# Patient Record
Sex: Female | Born: 1935 | Race: White | Hispanic: No | Marital: Married | Smoking: Never smoker
Health system: Southern US, Community
[De-identification: ages and names within clinical notes are randomized; demographics above are authoritative.]

## PROBLEM LIST (undated history)

## (undated) DIAGNOSIS — M1990 Unspecified osteoarthritis, unspecified site: Secondary | ICD-10-CM

## (undated) DIAGNOSIS — Z974 Presence of external hearing-aid: Secondary | ICD-10-CM

## (undated) DIAGNOSIS — T753XXA Motion sickness, initial encounter: Secondary | ICD-10-CM

## (undated) DIAGNOSIS — G43909 Migraine, unspecified, not intractable, without status migrainosus: Secondary | ICD-10-CM

## (undated) DIAGNOSIS — K219 Gastro-esophageal reflux disease without esophagitis: Secondary | ICD-10-CM

## (undated) DIAGNOSIS — I10 Essential (primary) hypertension: Secondary | ICD-10-CM

## (undated) DIAGNOSIS — J45909 Unspecified asthma, uncomplicated: Secondary | ICD-10-CM

## (undated) DIAGNOSIS — R609 Edema, unspecified: Secondary | ICD-10-CM

## (undated) HISTORY — PX: ABDOMINAL HYSTERECTOMY: SHX81

## (undated) HISTORY — PX: APPENDECTOMY: SHX54

## (undated) HISTORY — PX: TONSILLECTOMY: SUR1361

## (undated) HISTORY — PX: CHOLECYSTECTOMY: SHX55

## (undated) HISTORY — PX: GALLBLADDER SURGERY: SHX652

---

## 2007-08-21 ENCOUNTER — Ambulatory Visit: Payer: Self-pay | Admitting: Emergency Medicine

## 2007-09-26 ENCOUNTER — Ambulatory Visit: Payer: Self-pay | Admitting: Internal Medicine

## 2007-10-08 ENCOUNTER — Ambulatory Visit: Payer: Self-pay | Admitting: Emergency Medicine

## 2007-10-10 ENCOUNTER — Inpatient Hospital Stay: Payer: Self-pay | Admitting: Internal Medicine

## 2007-10-10 ENCOUNTER — Ambulatory Visit: Payer: Self-pay | Admitting: Internal Medicine

## 2007-10-13 ENCOUNTER — Inpatient Hospital Stay: Payer: Self-pay | Admitting: Internal Medicine

## 2007-10-13 ENCOUNTER — Other Ambulatory Visit: Payer: Self-pay

## 2009-04-17 ENCOUNTER — Ambulatory Visit: Payer: Self-pay | Admitting: Internal Medicine

## 2009-09-28 ENCOUNTER — Ambulatory Visit: Payer: Self-pay | Admitting: Family Medicine

## 2010-02-19 ENCOUNTER — Emergency Department: Payer: Self-pay | Admitting: Emergency Medicine

## 2010-03-19 ENCOUNTER — Ambulatory Visit: Payer: Self-pay | Admitting: Family Medicine

## 2010-07-21 ENCOUNTER — Ambulatory Visit: Payer: Self-pay | Admitting: Family Medicine

## 2010-07-23 ENCOUNTER — Ambulatory Visit: Payer: Self-pay | Admitting: Family Medicine

## 2011-08-10 ENCOUNTER — Ambulatory Visit: Payer: Self-pay | Admitting: Family Medicine

## 2012-01-14 ENCOUNTER — Ambulatory Visit: Payer: Self-pay | Admitting: Ophthalmology

## 2012-01-14 LAB — POTASSIUM: Potassium: 4.4 mmol/L (ref 3.5–5.1)

## 2012-02-01 ENCOUNTER — Ambulatory Visit: Payer: Self-pay | Admitting: Ophthalmology

## 2012-03-01 ENCOUNTER — Ambulatory Visit: Payer: Self-pay | Admitting: Ophthalmology

## 2012-03-01 LAB — POTASSIUM: Potassium: 4.1 mmol/L (ref 3.5–5.1)

## 2012-03-14 ENCOUNTER — Ambulatory Visit: Payer: Self-pay | Admitting: Ophthalmology

## 2012-08-27 ENCOUNTER — Ambulatory Visit: Payer: Self-pay | Admitting: Internal Medicine

## 2012-08-27 LAB — URINALYSIS, COMPLETE
Bilirubin,UR: NEGATIVE
Ketone: NEGATIVE
Ph: 7 (ref 4.5–8.0)
Specific Gravity: 1.015 (ref 1.003–1.030)

## 2012-11-09 ENCOUNTER — Ambulatory Visit: Payer: Self-pay | Admitting: Family Medicine

## 2013-06-17 ENCOUNTER — Ambulatory Visit: Payer: Self-pay | Admitting: Emergency Medicine

## 2013-10-27 ENCOUNTER — Ambulatory Visit: Payer: Self-pay | Admitting: Family Medicine

## 2013-11-22 ENCOUNTER — Ambulatory Visit: Payer: Self-pay | Admitting: Family Medicine

## 2013-12-15 ENCOUNTER — Ambulatory Visit: Payer: Self-pay | Admitting: Family Medicine

## 2014-02-18 ENCOUNTER — Ambulatory Visit: Payer: Self-pay | Admitting: Internal Medicine

## 2014-10-11 ENCOUNTER — Ambulatory Visit: Payer: Self-pay | Admitting: Ophthalmology

## 2015-02-18 ENCOUNTER — Ambulatory Visit: Payer: Self-pay | Admitting: Family Medicine

## 2015-02-26 ENCOUNTER — Ambulatory Visit: Payer: Self-pay | Admitting: Family Medicine

## 2015-04-21 NOTE — Op Note (Signed)
PATIENT NAME:  Desiree Keith, [name] MR#:  [id number] DATE OF BIRTH:  01/16/1932  DATE OF PROCEDURE:  02/01/2012  PREOPERATIVE DIAGNOSIS: Cataract, left eye.   POSTOPERATIVE DIAGNOSIS: Cataract, left eye.   PROCEDURE PERFORMED: Extracapsular cataract extraction using phacoemulsification with placement of an Alcon SN6CWS, 23-diopter posterior chamber lens, serial # [id number].   SURGEON: Maylon PeppersSteven A. Syona Wroblewski, M.D.   ASSISTANT: None.   ANESTHESIA: 4% lidocaine and 0.75% Marcaine in a 50-50 mixture with 10 units/mL of   Hylenex given as a peribulbar.   ANESTHESIOLOGIST: Dr. Darleene CleaverVan Staveren   COMPLICATIONS: None.   ESTIMATED BLOOD LOSS: Less than 1 mL.   DESCRIPTION OF PROCEDURE:  The patient was brought to the operating room and given a peribulbar block.  The patient was then prepped and draped in the usual fashion.  The vertical rectus muscles were imbricated using 5-0 silk sutures.  These sutures were then clamped to the sterile drapes as bridle sutures.  A limbal peritomy was performed extending two clock hours and hemostasis was obtained with cautery.  A partial thickness scleral groove was made at the surgical limbus and dissected anteriorly in a lamellar dissection using an Alcon crescent knife.  The anterior chamber was entered supero-temporally with a Superblade and through the lamellar dissection with a 2.6 mm keratome.  DisCoVisc was used to replace the aqueous and a continuous tear capsulorrhexis was carried out.  Hydrodissection and hydrodelineation were carried out with balanced salt and a 27 gauge canula.  The nucleus was rotated to confirm the effectiveness of the hydrodissection.  Phacoemulsification was carried out using a divide-and-conquer technique.  Total ultrasound time was 1 minute and 7 seconds with an average power of  14.1 percent.  Irrigation/aspiration was used to remove the residual cortex.  DisCoVisc was used to inflate the capsule and the internal incision was enlarged  to 3 mm with the crescent knife.  The intraocular lens was folded and inserted into the capsular bag using the Acrysert delivery system.  Irrigation/aspiration was used to remove the residual DisCoVisc.  Miostat was injected into the anterior chamber through the paracentesis track to inflate the anterior chamber and induce miosis.  The wound was checked for leaks and none were found. The conjunctiva was closed with cautery and the bridle sutures were removed.  Two drops of 0.3% Vigamox were placed on the eye.   An eye shield was placed on the eye.  The patient was discharged to the recovery room in good condition.  ____________________________ Maylon PeppersSteven A. Osiris Odriscoll, MD sad:bjt D: 02/01/2012 12:28:17 ET T: 02/01/2012 12:50:13 ET JOB#: [id number]  cc: Viviann SpareSteven A. Aniayah Alaniz, MD, <Dictator> Erline LevineSTEVEN A Jaydien Panepinto MD ELECTRONICALLY SIGNED 02/08/2012 13:02

## 2015-04-21 NOTE — Op Note (Signed)
PATIENT NAME:  Desiree Keith, Desiree Keith MR#:  [id number] DATE OF BIRTH:  07-29-1932  DATE OF PROCEDURE:  03/14/2012  PREOPERATIVE DIAGNOSIS:  Cataract, right eye.   POSTOPERATIVE DIAGNOSIS:  Cataract, right eye.  PROCEDURE PERFORMED:  Extracapsular cataract extraction using phacoemulsification with placement of an Alcon SN6CWS, 23.5-diopter posterior chamber lens, serial # [id number].  SURGEON:  Maylon PeppersSteven A. Henning Ehle, MD  ASSISTANT:  None.  ANESTHESIA:  4% lidocaine and 0.75% Marcaine in a 50/50 mixture with 10 units/mL of Vitrase added, given as a peribulbar.  ANESTHESIOLOGIST:  Dr. Noralyn Pickarroll.   COMPLICATIONS:  None.  ESTIMATED BLOOD LOSS:  Less than 1 mL.  DESCRIPTION OF PROCEDURE:  The patient was brought to the operating room and given a peribulbar block.  The patient was then prepped and draped in the usual fashion.  The vertical rectus muscles were imbricated using 5-0 silk sutures.  These sutures were then clamped to the sterile drapes as bridle sutures.  A limbal peritomy was performed extending two clock hours and hemostasis was obtained with cautery.  A partial thickness scleral groove was made at the surgical limbus and dissected anteriorly in a lamellar dissection using an Alcon crescent knife.  The anterior chamber was entered superonasally with a Superblade and through the lamellar dissection with a 2.6 mm keratome.  DisCoVisc was used to replace the aqueous and a continuous tear capsulorrhexis was carried out.  Hydrodissection and hydrodelineation were carried out with balanced salt and a 27 gauge canula.  The nucleus was rotated to confirm the effectiveness of the hydrodissection.  Phacoemulsification was carried out using a divide-and-conquer technique.  Total ultrasound time was 1 minute and 5.9 seconds with an average power of 16.6 percent. CDE 18.70.  Irrigation/aspiration was used to remove the residual cortex.  DisCoVisc was used to inflate the capsule and the internal incision  was enlarged to 3 mm with the crescent knife.  The intraocular lens was folded and inserted into the capsular bag using the AcrySert delivery system.  Irrigation/aspiration was used to remove the residual DisCoVisc.  Miostat was injected into the anterior chamber through the paracentesis track to inflate the anterior chamber and induce miosis.  The wound was checked for leaks and none were found. The conjunctiva was closed with cautery and the bridle sutures were removed.  Two drops of 0.3% Vigamox were placed on the eye.   An eye shield was placed on the eye.  The patient was discharged to the recovery room in good condition.  ____________________________ Maylon PeppersSteven A. Daylyn Azbill, MD sad:cms D: 03/14/2012 13:25:59 ET T: 03/14/2012 13:49:49 ET JOB#: [id number]  cc: Viviann SpareSteven A. Camil Hausmann, MD, <Dictator>  Erline LevineSTEVEN A Kirsten Spearing MD ELECTRONICALLY SIGNED 03/16/2012 15:16

## 2015-06-29 ENCOUNTER — Ambulatory Visit
Admission: EM | Admit: 2015-06-29 | Discharge: 2015-06-29 | Disposition: A | Discharge status: Home / Self Care | Payer: Medicare Other | Attending: Family Medicine | Admitting: Family Medicine

## 2015-06-29 ENCOUNTER — Encounter: Payer: Self-pay | Admitting: Emergency Medicine

## 2015-06-29 DIAGNOSIS — R300 Dysuria: Secondary | ICD-10-CM | POA: Insufficient documentation

## 2015-06-29 DIAGNOSIS — R350 Frequency of micturition: Secondary | ICD-10-CM | POA: Diagnosis not present

## 2015-06-29 HISTORY — DX: Edema, unspecified: R60.9

## 2015-06-29 LAB — URINALYSIS COMPLETE WITH MICROSCOPIC (ARMC ONLY)
BILIRUBIN URINE: NEGATIVE
GLUCOSE, UA: NEGATIVE mg/dL
KETONES UR: NEGATIVE mg/dL
Nitrite: NEGATIVE
Protein, ur: NEGATIVE mg/dL
Specific Gravity, Urine: 1.015 (ref 1.005–1.030)
pH: 6.5 (ref 5.0–8.0)

## 2015-06-29 MED ORDER — CIPROFLOXACIN HCL 250 MG PO TABS: 250.0000 mg | ORAL_TABLET | Freq: Two times a day (BID) | ORAL | Status: DC

## 2015-06-29 NOTE — ED Notes (Signed)
Patient stated burning when urinating, pain. Was put on cipro 10 day last taken on Sunday June 26th 2016.

## 2015-07-01 ENCOUNTER — Encounter: Payer: Self-pay | Admitting: Physician Assistant

## 2015-07-01 LAB — URINE CULTURE

## 2015-07-01 NOTE — ED Provider Notes (Signed)
CSN: [id number]     Arrival date & time 06/29/15  1138 History   None    Chief Complaint  Patient presents with  . Urinary Frequency   (Consider location/radiation/quality/duration/timing/severity/associated sxs/prior Treatment) HPI  81 yo F with chronic recurrent UTIs [facility] by [facility] . Has just recently ended a 10 day course of Cipro on Sunday, 6 days ago. Reports 3-4 days of feeling "great" then  Redeveloping dysuria and frequency. Presents quickly out of concern that "I get sick from it fast". Has had severe reactions to medications in the past--cannot take Moxifloxin or Levaquin but has NO difficulty tolerating Cipro by her report.Cannot take Macrobid and two attempts left her in the emergency room with reactions..Does not tolerate Dilaudid. Past Medical History  Diagnosis Date  . Edema    Past Surgical History  Procedure Laterality Date  . Appendectomy    . Tonsillectomy    . Gallbladder surgery    . Abdominal hysterectomy     History reviewed. No pertinent family history. History  Substance Use Topics  . Smoking status: Never Smoker   . Smokeless tobacco: Never Used  . Alcohol Use: No   OB History    No data available     Review of Systems Constitutional -afebrile Eyes-denies visual changes ENT- normal voice,denies sore throat CV-denies chest pain Resp-denies SOB Back - No CVAT, no spinal tenderness GI- negative for nausea,vomiting, diarrhea, has low pelvic bladder pressure,  GU- posative for dysuria,frequency MSK- negative for back pain, ambulatory Skin- denies acute changes Neuro- negative headache,focal weakness or numbness    Allergies  Macrobid; Demerol; Levaquin; and Moxifloxacin  Home Medications   Prior to Admission medications   Medication Sig Start Date End Date Taking? Authorizing Provider  cetirizine (ZYRTEC) 10 MG tablet Take 10 mg by mouth daily.   Yes Historical Provider, MD   hydrochlorothiazide (HYDRODIURIL) 25 MG tablet Take 25 mg by mouth daily.   Yes Historical Provider, MD  mometasone (NASONEX) 50 MCG/ACT nasal spray Place 2 sprays into the nose daily.   Yes Historical Provider, MD  potassium chloride SA (K-DUR,KLOR-CON) 20 MEQ tablet Take 20 mEq by mouth once.   Yes Historical Provider, MD  ranitidine (ZANTAC) 150 MG capsule Take 150 mg by mouth every evening.   Yes Historical Provider, MD  ciprofloxacin (CIPRO) 250 MG tablet Take 1 tablet (250 mg total) by mouth 2 (two) times daily. 06/29/15   Rae HalstedLaurie W Jaylynne Birkhead, PA-C   BP 126/71 mmHg  Pulse 76  Temp(Src) 98 F (36.7 C) (Oral)  Resp 16  Ht 5\' 3"  (1.6 m)  Wt 155 lb (70.308 kg)  BMI 27.46 kg/m2  SpO2 99% Physical Exam   Constitutional -alert and oriented,well appearing and in mild distress Head-atraumatic, normocephalic Eyes- conjunctiva normal, EOMI ,conjugate gaze Nose- no congestion or rhinorrhea Mouth/throat- mucous membranes moist ,oropharynx non-erythematous Neck- supple without glandular enlargement CV- regular rate, grossly normal heart sounds,  Resp-no distress, normal respiratory effort,clear to auscultation bilaterally GI- soft,mild tenderness behind mons pubis,no distention GU- not examined MSK- no tender, normal ROM, all extremities, ambulatory, self-care Neuro- normal speech and language, no gross focal neurological deficit appreciated, no gait instability, Skin-warm,dry ,intact; no rash noted Psych-mood and affect grossly normal; speech and behavior grossly normal ED Course  Procedures (including critical care time) Labs Review Labs Reviewed  URINALYSIS COMPLETEWITH MICROSCOPIC ([facility] ONLY) - Abnormal; Notable for the following:    Color, Urine STRAW (*)  APPearance HAZY (*)    Hgb urine dipstick TRACE (*)    Leukocytes, UA 1+ (*)    Squamous Epithelial / LPF 0-5 (*)    All other components within normal limits  URINE CULTURE    Imaging Review No results found.    MDM    1. Dysuria   2. Frequency of urination    . Diagnosis and treatment discussed. . Questions fielded, expectations and recommendations reviewed. Will submit urine for culture- in interim  start 3 days of Cipro as it has recently been well tolerated. Increase hydration-may use cranberry tablets if she wishes.  Patient expresses understanding. Will report UC as available and make antibiotic coverage decisions at that time. [facility] return to [facility] with questions, concern or exacerbation. Marland Kitchen  Discharge Medication List as of 06/29/2015  2:11 PM    START taking these medications   Details  ciprofloxacin (CIPRO) 250 MG tablet Take 1 tablet (250 mg total) by mouth 2 (two) times daily., Starting 06/29/2015, Until Discontinued, Print        Rae Halsted, PA-C 07/02/15 1635

## 2015-11-19 ENCOUNTER — Ambulatory Visit
Admission: EM | Admit: 2015-11-19 | Discharge: 2015-11-19 | Disposition: A | Discharge status: Home / Self Care | Payer: Medicare Other | Attending: Family Medicine | Admitting: Family Medicine

## 2015-11-19 ENCOUNTER — Ambulatory Visit: Payer: Medicare Other

## 2015-11-19 DIAGNOSIS — J0110 Acute frontal sinusitis, unspecified: Secondary | ICD-10-CM | POA: Insufficient documentation

## 2015-11-19 DIAGNOSIS — H66001 Acute suppurative otitis media without spontaneous rupture of ear drum, right ear: Secondary | ICD-10-CM | POA: Insufficient documentation

## 2015-11-19 DIAGNOSIS — J45901 Unspecified asthma with (acute) exacerbation: Secondary | ICD-10-CM | POA: Diagnosis not present

## 2015-11-19 DIAGNOSIS — H6592 Unspecified nonsuppurative otitis media, left ear: Secondary | ICD-10-CM | POA: Diagnosis not present

## 2015-11-19 DIAGNOSIS — R51 Headache: Secondary | ICD-10-CM | POA: Insufficient documentation

## 2015-11-19 DIAGNOSIS — J4521 Mild intermittent asthma with (acute) exacerbation: Secondary | ICD-10-CM | POA: Diagnosis not present

## 2015-11-19 DIAGNOSIS — R05 Cough: Secondary | ICD-10-CM | POA: Insufficient documentation

## 2015-11-19 HISTORY — DX: Gastro-esophageal reflux disease without esophagitis: K21.9

## 2015-11-19 HISTORY — DX: Essential (primary) hypertension: I10

## 2015-11-19 LAB — RAPID INFLUENZA A&B ANTIGENS (ARMC ONLY): INFLUENZA A (ARMC): NOT DETECTED

## 2015-11-19 LAB — RAPID INFLUENZA A&B ANTIGENS: Influenza B (ARMC): NOT DETECTED

## 2015-11-19 MED ORDER — AMOXICILLIN-POT CLAVULANATE 875-125 MG PO TABS: 1.0000 | ORAL_TABLET | Freq: Two times a day (BID) | ORAL | Status: DC

## 2015-11-19 MED ORDER — BENZONATATE 200 MG PO CAPS: 200.0000 mg | ORAL_CAPSULE | Freq: Three times a day (TID) | ORAL | Status: AC | PRN

## 2015-11-19 MED ORDER — SALINE SPRAY 0.65 % NA SOLN: 2.0000 | NASAL | Status: AC

## 2015-11-19 MED ORDER — PREDNISONE 50 MG PO TABS: 50.0000 mg | ORAL_TABLET | Freq: Every day | ORAL | Status: AC

## 2015-11-19 MED ORDER — IPRATROPIUM-ALBUTEROL 0.5-2.5 (3) MG/3ML IN SOLN: 3.0000 mL | Freq: Four times a day (QID) | RESPIRATORY_TRACT | Status: AC | PRN

## 2015-11-19 MED ORDER — IPRATROPIUM-ALBUTEROL 0.5-2.5 (3) MG/3ML IN SOLN
3.0000 mL | Freq: Once | RESPIRATORY_TRACT | Status: DC
Start: 2015-11-19 — End: 2015-11-19

## 2015-11-19 NOTE — ED Notes (Signed)
Sudden onset at 4am yesterday with headache. Continued with joint pain, fever, cough.

## 2015-11-19 NOTE — Discharge Instructions (Signed)
Asthma Attack Prevention °While you may not be able to control the fact that you have asthma, you can take actions to prevent asthma attacks. The best way to prevent asthma attacks is to maintain good control of your asthma. You can achieve this by: °· Taking your medicines as directed. °· Avoiding things that can irritate your airways or make your asthma symptoms worse (asthma triggers). °· Keeping track of how well your asthma is controlled and of any changes in your symptoms. °· Responding quickly to worsening asthma symptoms (asthma attack). °· Seeking emergency care when it is needed. °WHAT ARE SOME WAYS TO PREVENT AN ASTHMA ATTACK? °Have a Plan °Work with your health care provider to create a written plan for managing and treating your asthma attacks (asthma action plan). This plan includes: °· A list of your asthma triggers and how you can avoid them. °· Information on when medicines should be taken and when their dosages should be changed. °· The use of a device that measures how well your lungs are working (peak flow meter). °Monitor Your Asthma °Use your peak flow meter and record your results in a journal every day. A drop in your peak flow numbers on one or more days may indicate the start of an asthma attack. This can happen even before you start to feel symptoms. You can prevent an asthma attack from getting worse by following the steps in your asthma action plan. °Avoid Asthma Triggers °Work with your asthma health care provider to find out what your asthma triggers are. This can be done by: °· Allergy testing. °· Keeping a journal that notes when asthma attacks occur and the factors that may have contributed to them. °· Determining if there are other medical conditions that are making your asthma worse. °Once you have determined your asthma triggers, take steps to avoid them. This may include avoiding excessive or prolonged exposure to: °· Dust. Have someone dust and vacuum your home for you once or  twice a week. Using a high-efficiency particulate arrestance (HEPA) vacuum is best. °· Smoke. This includes campfire smoke, forest fire smoke, and secondhand smoke from tobacco products. °· Pet dander. Avoid contact with animals that you know you are allergic to. °· Allergens from trees, grasses or pollens. Avoid spending a lot of time outdoors when pollen counts are high, and on very windy days. °· Very cold, dry, or humid air. °· Mold. °· Foods that contain high amounts of sulfites. °· Strong odors. °· Outdoor air pollutants, such as engine exhaust. °· Indoor air pollutants, such as aerosol sprays and fumes from household cleaners. °· Household pests, including dust mites and cockroaches, and pest droppings. °· Certain medicines, including NSAIDs. Always talk to your health care provider before stopping or starting any new medicines. °Medicines °Take over-the-counter and prescription medicines only as told by your health care provider. Many asthma attacks can be prevented by carefully following your medicine schedule. Taking your medicines correctly is especially important when you cannot avoid certain asthma triggers. °Act Quickly °If an asthma attack does happen, acting quickly can decrease how severe it is and how long it lasts. Take these steps:  °· Pay attention to your symptoms. If you are coughing, wheezing, or having difficulty breathing, do not wait to see if your symptoms go away on their own. Follow your asthma action plan. °· If you have followed your asthma action plan and your symptoms are not improving, call your health care provider or seek immediate medical care   at the nearest hospital. It is important to note how often you need to use your fast-acting rescue inhaler. If you are using your rescue inhaler more often, it may mean that your asthma is not under control. Adjusting your asthma treatment plan may help you to prevent future asthma attacks and help you to gain better control of your  condition. HOW CAN I PREVENT AN ASTHMA ATTACK WHEN I EXERCISE? Follow advice from your health care provider about whether you should use your fast-acting inhaler before exercising. Many people with asthma experience exercise-induced bronchoconstriction (EIB). This condition often worsens during vigorous exercise in cold, humid, or dry environments. Usually, people with EIB can stay very active by pre-treating with a fast-acting inhaler before exercising.   This information is not intended to replace advice given to you by your health care provider. Make sure you discuss any questions you have with your health care provider.   Document Released: 12/02/2009 Document Revised: 09/04/2015 Document Reviewed: 05/16/2015 Elsevier Interactive Patient Education 2016 [employer]  Cough, Adult Coughing is a reflex that clears your throat and your airways. Coughing helps to heal and protect your lungs. It is normal to cough occasionally, but a cough that happens with other symptoms or lasts a long time may be a sign of a condition that needs treatment. A cough may last only 2-3 weeks (acute), or it may last longer than 8 weeks (chronic). CAUSES Coughing is commonly caused by:  Breathing in substances that irritate your lungs.  A viral or bacterial respiratory infection.  Allergies.  Asthma.  Postnasal drip.  Smoking.  Acid backing up from the stomach into the esophagus (gastroesophageal reflux).  Certain medicines.  Chronic lung problems, including COPD (or rarely, lung cancer).  Other medical conditions such as heart failure. HOME CARE INSTRUCTIONS  Pay attention to any changes in your symptoms. Take these actions to help with your discomfort:  Take medicines only as told by your health care provider.  If you were prescribed an antibiotic medicine, take it as told by your health care provider. Do not stop taking the antibiotic even if you start to feel better.  Talk with your health  care provider before you take a cough suppressant medicine.  Drink enough fluid to keep your urine clear or pale yellow.  If the air is dry, use a cold steam vaporizer or humidifier in your bedroom or your home to help loosen secretions.  Avoid anything that causes you to cough at work or at home.  If your cough is worse at night, try sleeping in a semi-upright position.  Avoid cigarette smoke. If you smoke, quit smoking. If you need help quitting, ask your health care provider.  Avoid caffeine.  Avoid alcohol.  Rest as needed. SEEK MEDICAL CARE IF:   You have new symptoms.  You cough up pus.  Your cough does not get better after 2-3 weeks, or your cough gets worse.  You cannot control your cough with suppressant medicines and you are losing sleep.  You develop pain that is getting worse or pain that is not controlled with pain medicines.  You have a fever.  You have unexplained weight loss.  You have night sweats. SEEK IMMEDIATE MEDICAL CARE IF:  You cough up blood.  You have difficulty breathing.  Your heartbeat is very fast.   This information is not intended to replace advice given to you by your health care provider. Make sure you discuss any questions you have with your health  care provider.   Document Released: 06/12/2011 Document Revised: 09/04/2015 Document Reviewed: 02/20/2015 Elsevier Interactive Patient Education 2016 [employer] Otitis Media With Effusion Otitis media with effusion is the presence of fluid in the middle ear. This is a common problem in children, which often follows ear infections. It may be present for weeks or longer after the infection. Unlike an acute ear infection, otitis media with effusion refers only to fluid behind the ear drum and not infection. Children with repeated ear and sinus infections and allergy problems are the most likely to get otitis media with effusion. CAUSES  The most frequent cause of the fluid buildup is  dysfunction of the eustachian tubes. These are the tubes that drain fluid in the ears to the back of the nose (nasopharynx). SYMPTOMS   The main symptom of this condition is hearing loss. As a result, you or your child may:  Listen to the TV at a loud volume.  Not respond to questions.  Ask "what" often when spoken to.  Mistake or confuse one sound or word for another.  There may be a sensation of fullness or pressure but usually not pain. DIAGNOSIS   Your health care provider will diagnose this condition by examining you or your child's ears.  Your health care provider may test the pressure in you or your child's ear with a tympanometer.  A hearing test may be conducted if the problem persists. TREATMENT   Treatment depends on the duration and the effects of the effusion.  Antibiotics, decongestants, nose drops, and cortisone-type drugs (tablets or nasal spray) may not be helpful.  Children with persistent ear effusions may have delayed language or behavioral problems. Children at risk for developmental delays in hearing, learning, and speech may require referral to a specialist earlier than children not at risk.  You or your child's health care provider may suggest a referral to an ear, nose, and throat surgeon for treatment. The following may help restore normal hearing:  Drainage of fluid.  Placement of ear tubes (tympanostomy tubes).  Removal of adenoids (adenoidectomy). HOME CARE INSTRUCTIONS   Avoid secondhand smoke.  Infants who are breastfed are less likely to have this condition.  Avoid feeding infants while they are lying flat.  Avoid known environmental allergens.  Avoid people who are sick. SEEK MEDICAL CARE IF:   Hearing is not better in 3 months.  Hearing is worse.  Ear pain.  Drainage from the ear.  Dizziness. MAKE SURE YOU:   Understand these instructions.  Will watch your condition.  Will get help right away if you are not doing well  or get worse.   This information is not intended to replace advice given to you by your health care provider. Make sure you discuss any questions you have with your health care provider.   Document Released: 01/21/2005 Document Revised: 01/04/2015 Document Reviewed: 07/11/2013 Elsevier Interactive Patient Education 2016 [employer]. Otitis Media, Adult Otitis media is redness, soreness, and inflammation of the middle ear. Otitis media may be caused by allergies or, most commonly, by infection. Often it occurs as a complication of the common cold. SIGNS AND SYMPTOMS Symptoms of otitis media may include:  Earache.  Fever.  Ringing in your ear.  Headache.  Leakage of fluid from the ear. DIAGNOSIS To diagnose otitis media, your health care provider will examine your ear with an otoscope. This is an instrument that allows your health care provider to see into your ear in order to examine your  eardrum. Your health care provider also will ask you questions about your symptoms. TREATMENT  Typically, otitis media resolves on its own within 3-5 days. Your health care provider may prescribe medicine to ease your symptoms of pain. If otitis media does not resolve within 5 days or is recurrent, your health care provider may prescribe antibiotic medicines if he or she suspects that a bacterial infection is the cause. HOME CARE INSTRUCTIONS   If you were prescribed an antibiotic medicine, finish it all even if you start to feel better.  Take medicines only as directed by your health care provider.  Keep all follow-up visits as directed by your health care provider. SEEK MEDICAL CARE IF:  You have otitis media only in one ear, or bleeding from your nose, or both.  You notice a lump on your neck.  You are not getting better in 3-5 days.  You feel worse instead of better. SEEK IMMEDIATE MEDICAL CARE IF:   You have pain that is not controlled with medicine.  You have swelling, redness, or  pain around your ear or stiffness in your neck.  You notice that part of your face is paralyzed.  You notice that the bone behind your ear (mastoid) is tender when you touch it. MAKE SURE YOU:   Understand these instructions.  Will watch your condition.  Will get help right away if you are not doing well or get worse.   This information is not intended to replace advice given to you by your health care provider. Make sure you discuss any questions you have with your health care provider.   Document Released: 09/18/2004 Document Revised: 01/04/2015 Document Reviewed: 07/11/2013 Elsevier Interactive Patient Education 2016 [employer] Sinusitis, Adult Sinusitis is redness, soreness, and inflammation of the paranasal sinuses. Paranasal sinuses are air pockets within the bones of your face. They are located beneath your eyes, in the middle of your forehead, and above your eyes. In healthy paranasal sinuses, mucus is able to drain out, and air is able to circulate through them by way of your nose. However, when your paranasal sinuses are inflamed, mucus and air can become trapped. This can allow bacteria and other germs to grow and cause infection. Sinusitis can develop quickly and last only a short time (acute) or continue over a long period (chronic). Sinusitis that lasts for more than 12 weeks is considered chronic. CAUSES Causes of sinusitis include:  Allergies.  Structural abnormalities, such as displacement of the cartilage that separates your nostrils (deviated septum), which can decrease the air flow through your nose and sinuses and affect sinus drainage.  Functional abnormalities, such as when the small hairs (cilia) that line your sinuses and help remove mucus do not work properly or are not present. SIGNS AND SYMPTOMS Symptoms of acute and chronic sinusitis are the same. The primary symptoms are pain and pressure around the affected sinuses. Other symptoms include:  Upper  toothache.  Earache.  Headache.  Bad breath.  Decreased sense of smell and taste.  A cough, which worsens when you are lying flat.  Fatigue.  Fever.  Thick drainage from your nose, which often is green and may contain pus (purulent).  Swelling and warmth over the affected sinuses. DIAGNOSIS Your health care provider will perform a physical exam. During your exam, your health care provider may perform any of the following to help determine if you have acute sinusitis or chronic sinusitis:  Look in your nose for signs of abnormal growths in your  nostrils (nasal polyps).  Tap over the affected sinus to check for signs of infection.  View the inside of your sinuses using an imaging device that has a light attached (endoscope). If your health care provider suspects that you have chronic sinusitis, one or more of the following tests may be recommended:  Allergy tests.  Nasal culture. A sample of mucus is taken from your nose, sent to a lab, and screened for bacteria.  Nasal cytology. A sample of mucus is taken from your nose and examined by your health care provider to determine if your sinusitis is related to an allergy. TREATMENT Most cases of acute sinusitis are related to a viral infection and will resolve on their own within 10 days. Sometimes, medicines are prescribed to help relieve symptoms of both acute and chronic sinusitis. These may include pain medicines, decongestants, nasal steroid sprays, or saline sprays. However, for sinusitis related to a bacterial infection, your health care provider will prescribe antibiotic medicines. These are medicines that will help kill the bacteria causing the infection. Rarely, sinusitis is caused by a fungal infection. In these cases, your health care provider will prescribe antifungal medicine. For some cases of chronic sinusitis, surgery is needed. Generally, these are cases in which sinusitis recurs more than 3 times per year, despite  other treatments. HOME CARE INSTRUCTIONS  Drink plenty of water. Water helps thin the mucus so your sinuses can drain more easily.  Use a humidifier.  Inhale steam 3-4 times a day (for example, sit in the bathroom with the shower running).  Apply a warm, moist washcloth to your face 3-4 times a day, or as directed by your health care provider.  Use saline nasal sprays to help moisten and clean your sinuses.  Take medicines only as directed by your health care provider.  If you were prescribed either an antibiotic or antifungal medicine, finish it all even if you start to feel better. SEEK IMMEDIATE MEDICAL CARE IF:  You have increasing pain or severe headaches.  You have nausea, vomiting, or drowsiness.  You have swelling around your face.  You have vision problems.  You have a stiff neck.  You have difficulty breathing.   This information is not intended to replace advice given to you by your health care provider. Make sure you discuss any questions you have with your health care provider.   Document Released: 12/14/2005 Document Revised: 01/04/2015 Document Reviewed: 12/29/2011 Elsevier Interactive Patient Education [employer]

## 2015-11-19 NOTE — ED Provider Notes (Addendum)
CSN: [id number]     Arrival date & time 11/19/15  1242 History   First MD Initiated Contact with Patient 11/19/15 1346     Chief Complaint  Patient presents with  . Influenza   (Consider location/radiation/quality/duration/timing/severity/associated sxs/prior Treatment) HPI Comments: Married caucasian female retired here for evaluation of cough, sweats t 101 at home today dry cough, headache, chest and nasal congestion, tired achy joints, wheezing, hurts to cough in chest ear pain teeth pain started in cheeks woke her up 0300 started mucinex and water for congestion.  Cannot tolerate nasonex nose bleeds in the past. Saline ok on zyrtec daily for seasonal allergies.  Hasn't had sinus infection in years.    The history is provided by the patient.    Past Medical History  Diagnosis Date  . Edema   . Hypertension   . GERD (gastroesophageal reflux disease)    Past Surgical History  Procedure Laterality Date  . Appendectomy    . Tonsillectomy    . Gallbladder surgery    . Abdominal hysterectomy    . Cholecystectomy     Family History  Problem Relation Age of Onset  . Heart failure Father    Social History  Substance Use Topics  . Smoking status: Never Smoker   . Smokeless tobacco: Never Used  . Alcohol Use: No   OB History    No data available     Review of Systems  Constitutional: Positive for fever, appetite change and fatigue. Negative for chills, diaphoresis, activity change and unexpected weight change.  HENT: Positive for congestion, ear pain, postnasal drip, sinus pressure and sore throat. Negative for dental problem, drooling, ear discharge, facial swelling, hearing loss, mouth sores, nosebleeds, rhinorrhea, sneezing, tinnitus, trouble swallowing and voice change.   Eyes: Negative for photophobia, pain, discharge, redness, itching and visual disturbance.  Respiratory: Positive for cough and wheezing. Negative for choking, chest tightness, shortness of breath and  stridor.   Cardiovascular: Positive for chest pain. Negative for palpitations and leg swelling.  Gastrointestinal: Negative for nausea, vomiting, abdominal pain, diarrhea, constipation, blood in stool and abdominal distention.  Endocrine: Negative for cold intolerance and heat intolerance.  Genitourinary: Negative for dysuria, hematuria and difficulty urinating.  Musculoskeletal: Positive for myalgias and arthralgias. Negative for back pain, joint swelling, gait problem, neck pain and neck stiffness.  Skin: Negative for color change, pallor, rash and wound.  Allergic/Immunologic: Positive for environmental allergies. Negative for food allergies.  Neurological: Positive for headaches. Negative for dizziness, tremors, seizures, syncope, facial asymmetry, speech difficulty, weakness, light-headedness and numbness.  Hematological: Negative for adenopathy. Does not bruise/bleed easily.  Psychiatric/Behavioral: Positive for sleep disturbance. Negative for behavioral problems, confusion and agitation.    Allergies  Macrobid; Demerol; Levaquin; and Moxifloxacin  Home Medications   Prior to Admission medications   Medication Sig Start Date End Date Taking? Authorizing Provider  cetirizine (ZYRTEC) 10 MG tablet Take 10 mg by mouth daily.   Yes Historical Provider, MD  hydrochlorothiazide (HYDRODIURIL) 25 MG tablet Take 25 mg by mouth daily.   Yes Historical Provider, MD  methenamine (HIPREX) 1 G tablet Take 1 g by mouth 2 (two) times daily with a meal.   Yes Historical Provider, MD  potassium chloride SA (K-DUR,KLOR-CON) 20 MEQ tablet Take 20 mEq by mouth once.   Yes Historical Provider, MD  ranitidine (ZANTAC) 150 MG capsule Take 150 mg by mouth every evening.   Yes Historical Provider, MD  vitamin C (ASCORBIC ACID) 500 MG tablet Take 500 mg  by mouth daily.   Yes Historical Provider, MD  amoxicillin-clavulanate (AUGMENTIN) 875-125 MG tablet Take 1 tablet by mouth every 12 (twelve) hours. 11/19/15    Barbaraann Barthel, NP  benzonatate (TESSALON) 200 MG capsule Take 1 capsule (200 mg total) by mouth 3 (three) times daily as needed for cough. 11/19/15 11/25/15  Barbaraann Barthel, NP  ipratropium-albuterol (DUONEB) 0.5-2.5 (3) MG/3ML SOLN Take 3 mLs by nebulization every 6 (six) hours as needed. 11/19/15 11/23/15  Barbaraann Barthel, NP  predniSONE (DELTASONE) 50 MG tablet Take 1 tablet (50 mg total) by mouth daily with breakfast. 11/20/15 11/25/15  Barbaraann Barthel, NP  sodium chloride (OCEAN) 0.65 % SOLN nasal spray Place 2 sprays into both nostrils every 2 (two) hours while awake. 11/19/15   Barbaraann Barthel, NP   Meds Ordered and Administered this Visit   Medications  ipratropium-albuterol (DUONEB) 0.5-2.5 (3) MG/3ML nebulizer solution 3 mL (not administered)    BP 154/87 mmHg  Pulse 100  Temp(Src) 99.7 F (37.6 C) (Tympanic)  Resp 20  Ht  (1.6 m)  Wt 152 lb (68.947 kg)  BMI 26.93 kg/m2  SpO2 97% No data found.   Physical Exam  Constitutional: She is oriented to person, place, and time. She appears well-developed and well-nourished. She is active and cooperative.  Non-toxic appearance. She does not have a sickly appearance. She appears ill. No distress.  HENT:  Head: Normocephalic and atraumatic.  Right Ear: Hearing, external ear and ear canal normal. Tympanic membrane is injected, erythematous and bulging. A middle ear effusion is present.  Left Ear: Hearing, external ear and ear canal normal. A middle ear effusion is present.  Nose: Mucosal edema and rhinorrhea present. No nose lacerations, sinus tenderness, nasal deformity, septal deviation or nasal septal hematoma. No epistaxis.  No foreign bodies. Right sinus exhibits maxillary sinus tenderness and frontal sinus tenderness. Left sinus exhibits maxillary sinus tenderness and frontal sinus tenderness.  Mouth/Throat: Uvula is midline and mucous membranes are normal. Mucous membranes are not pale, not dry and not cyanotic.  She does not have dentures. No oral lesions. No trismus in the jaw. Normal dentition. No dental abscesses, uvula swelling, lacerations or dental caries. Posterior oropharyngeal edema and posterior oropharyngeal erythema present. No oropharyngeal exudate or tonsillar abscesses.  Cobblestoning posterior pharynx; oropharyngeal edema/erythema macular; bilateral TMs with air fluid level opacity; bilateral nasal turbinates with edema/erythema; right auditory canal adjacent to TM with erythema/TM erythematous  Eyes: Conjunctivae, EOM and lids are normal. Pupils are equal, round, and reactive to light. Right eye exhibits no chemosis, no discharge, no exudate and no hordeolum. No foreign body present in the right eye. Left eye exhibits no chemosis, no discharge, no exudate and no hordeolum. No foreign body present in the left eye. Right conjunctiva is not injected. Right conjunctiva has no hemorrhage. Left conjunctiva is not injected. Left conjunctiva has no hemorrhage. No scleral icterus. Right eye exhibits normal extraocular motion and no nystagmus. Left eye exhibits normal extraocular motion and no nystagmus. Right pupil is round and reactive. Left pupil is round and reactive. Pupils are equal.  Neck: Trachea normal and normal range of motion. Neck supple. No tracheal tenderness, no spinous process tenderness and no muscular tenderness present. No rigidity. No tracheal deviation, no edema, no erythema and normal range of motion present. No thyroid mass and no thyromegaly present.  Cardiovascular: Normal rate, regular rhythm, S1 normal, S2 normal, normal heart sounds and intact distal pulses.  PMI is not displaced.  Exam reveals no gallop and no friction rub.   No murmur heard. Pulmonary/Chest: Effort normal. No accessory muscle usage or stridor. No respiratory distress. She has decreased breath sounds. She has wheezes. She has rhonchi. She has no rales. She exhibits no tenderness.  Negative egophany all fields;  initially wheezing expiratory BUL and decreased BLL/BML after duoneb rhonchi middle and increased airflow BLL wheeze resolved; coughing interrupts full sentences and if deep breath coughing  Abdominal: Soft. She exhibits no distension.  Musculoskeletal: Normal range of motion. She exhibits no edema or tenderness.       Right shoulder: Normal.       Left shoulder: Normal.       Right hip: Normal.       Left hip: Normal.       Right knee: Normal.       Left knee: Normal.       Cervical back: Normal.       Right hand: Normal.       Left hand: Normal.  Lymphadenopathy:       Head (right side): No submental, no submandibular, no tonsillar, no preauricular, no posterior auricular and no occipital adenopathy present.       Head (left side): No submental, no submandibular, no tonsillar, no preauricular, no posterior auricular and no occipital adenopathy present.    She has no cervical adenopathy.       Right cervical: No superficial cervical, no deep cervical and no posterior cervical adenopathy present.      Left cervical: No superficial cervical, no deep cervical and no posterior cervical adenopathy present.  Neurological: She is alert and oriented to person, place, and time. She has normal strength. She is not disoriented. She displays no atrophy and no tremor. No cranial nerve deficit or sensory deficit. She exhibits normal muscle tone. She displays no seizure activity. Coordination and gait normal. GCS eye subscore is 4. GCS verbal subscore is 5. GCS motor subscore is 6.  Skin: Skin is warm, dry and intact. No abrasion, no bruising, no burn, no ecchymosis, no laceration, no lesion, no petechiae and no rash noted. She is not diaphoretic. No cyanosis or erythema. No pallor. Nails show no clubbing.  Psychiatric: She has a normal mood and affect. Her speech is normal and behavior is normal. Judgment and thought content normal. Cognition and memory are normal.  Nursing note and vitals reviewed.   ED  Course  Procedures (including critical care time)  Labs Review Labs Reviewed  RAPID INFLUENZA A&B ANTIGENS [facility] ONLY)    Imaging Review Dg Chest 2 View  11/19/2015  CLINICAL DATA:  Productive cough and fever for 2 days EXAM: CHEST - 2 VIEW COMPARISON:  10/14/2007 FINDINGS: The heart size and mediastinal contours are within normal limits. Both lungs are clear. The visualized skeletal structures are unremarkable. IMPRESSION: No active disease. Electronically Signed   By: Alcide Clever M.D.   On: 11/19/2015 13:30   1350 Discussed chest xray negative for fluid in lungs/pneumonia given copy of radiology report.  Expiratory wheeze noted along with decreased breath sounds BLL ordered duoneb.  Patient notified rapid flu negative.  Patient verbalized understanding of information/instructions, agreed with plan of care and had no further questions at this time.  1420 patient feeling better after duoneb 3ml administered by RN Leona Carry.  Rhonchi bilateral middle lobes, increased airflow basis, wheeze resolved.  Will Rx patient duoneb solution for home use start prednisone 50mg  po with breakfast tomorrow.  augmentin for sinusitis  and otitis media.  Rx given.  Patient gets nosebleeds with nasonex.  Restart nasal saline 2 sprays each nostril q2h prn congestion.  May increase zyrtec to BID. Tessalon pearles  po TID prn cough.   Patient verbalized understanding of information/instructions, agreed with plan of care and had no further questions at this time.  MDM   1. Asthma with exacerbation, mild intermittent   2. Acute frontal sinusitis, recurrence not specified   3. Acute suppurative otitis media of right ear without spontaneous rupture of tympanic membrane, recurrence not specified   4. Otitis media with effusion, left    Prednisone  po qam with breakfast starting tomorrow am.  duoneb solution 3ml po QID prn.  Rx given.  Medications as directed.  Patient is to return to the clinic or follow  up with PCM if there is increased wheezing or shortness of breath, increased use of albuterol.  Patient educated on the importance of continuing to monitor their peak flows.  Patient given Exitcare handout on asthma.  Patient verbalized agreement and understanding of treatment plan.  P2:  Asthma action plan, fluids, and fitness  Bronchitis simple, community acquired, may have started as viral (probably respiratory syncytial, parainfluenza, influenza, or adenovirus), but now evidence of acute purulent bronchitis with resultant bronchial edema and mucus formation.  Viruses are the most common cause of bronchial inflammation in otherwise healthy adults with acute bronchitis.  The appearance of sputum is not predictive of whether a bacterial infection is present.  Purulent sputum is most often caused by viral infections.  There are a small portion of those caused by non-viral agents being Mycoplamsa pneumonia.  Microscopic examination or C&S of sputum in the healthy adult with acute bronchitis is generally not helpful (usually negative or normal respiratory flora) other considerations being cough from upper respiratory tract infections, sinusitis or allergic syndromes (mild asthma or viral pneumonia).  Differential Diagnosis:  reactive airway disease (asthma, allergic aspergillosis (eosinophilia), chronic bronchitis, respiratory infection (Sinusitis, Common cold, pneumonia), congestive heart failure, reflux esophagitis, bronchogenic tumor, aspiration syndromes and/or exposure irritants/tobacco smoke.  In this case, there is no evidence of any invasive bacterial illness.  Most likely viral etiology so will hold on antibiotic treatment.  Advise supportive care with rest, encourage fluids, good hygiene and watch for any worsening symptoms.  If they were to develop:  come back to the office or go to the emergency room if after hours. Without high fever, severe dyspnea, lack of physical findings or other risk factors, I  will hold on CBC at this time.  I discussed that approximately 50% of patients with acute bronchitis have a cough that lasts up to three weeks, and 25% for over a month.  Tylenol, one to two tablets every four hours as needed for fever or myalgias.   No aspirin.  Patient instructed to follow up in one week or sooner if symptoms worsen. Patient verbalized agreement and understanding of treatment plan.  P2:  hand washing and cover cough  Supportive treatment.   No evidence of invasive bacterial infection, non toxic and well hydrated.  This is most likely self limiting viral infection.  I do not see where any further testing or imaging is necessary at this time.   I will suggest supportive care, rest, good hygiene and encourage the patient to take adequate fluids.  The patient is to return to clinic or EMERGENCY ROOM if symptoms worsen or change significantly e.g. ear pain, fever, purulent discharge from ears or bleeding.  Exitcare handout on otitis media with effusion given to patient.  Patient verbalized agreement and understanding of treatment plan.    Treatment as ordered.  Rx augmentin 875mg  po BID x 10 days.  Tylenol 1000mg  po QID prn pain/fever.  Symptomatic therapy suggested fluids, NSAIDs and rest.  May take Tylenol or Motrin for fevers.  Call or return to clinic as needed if these symptoms worsen or fail to improve as anticipated. Exitcare handout on otitis media given to patient.  Patient verbalized agreement and understanding of treatment plan.   P2:  Hand washing  Patient notified rapid flu negative.  Suspect Viral illness: no evidence of invasive bacterial infection, non toxic and well hydrated.  This is most likely self limiting viral infection.  I do not see where any further testing or imaging is necessary at this time.   I will suggest supportive care, rest, good hygiene and encourage the patient to take adequate fluids.  Does not require work excuse.  nasal saline 1-2 sprays each nostril prn  q2h, tylenol 1000mg  po QID prn tessalon pearles 200mg  po TID prn duoneb 3ml po QID prn wheeze/sob/protracted cough.  Discussed honey with lemon and salt water gargles for comfort also.  The patient is to return to clinic or EMERGENCY ROOM if symptoms worsen or change significantly e.g. fever, lethargy, SOB, wheezing.  Exitcare handout on viral illness given to patient.  Patient verbalized agreement and understanding of treatment plan.    augmentin 875mg  po BID x 10 days nasal saline 2 sprays each nostril q2h prn congestion.  No evidence of systemic bacterial infection, non toxic and well hydrated.  I do not see where any further testing or imaging is necessary at this time.   I will suggest supportive care, rest, good hygiene and encourage the patient to take adequate fluids.  The patient is to return to clinic or EMERGENCY ROOM if symptoms worsen or change significantly.  Exitcare handout on sinusitis given to patient.  Patient verbalized agreement and understanding of treatment plan and had no further questions at this time.   P2:  Hand washing and cover cough    Barbaraann Barthel, NP 11/19/15 1504  22 Nov 2015 preauthorization request received from [facility] and pharmacy. Went online to complete request and received note 24 hours before reply will be received from [employer] or via fax.  Barbaraann Barthel, NP 11/22/15 1717

## 2016-03-17 ENCOUNTER — Other Ambulatory Visit: Payer: Self-pay | Admitting: Family Medicine

## 2016-03-17 DIAGNOSIS — Z1231 Encounter for screening mammogram for malignant neoplasm of breast: Secondary | ICD-10-CM

## 2016-03-18 ENCOUNTER — Ambulatory Visit
Admission: RE | Admit: 2016-03-18 | Discharge: 2016-03-18 | Disposition: A | Discharge status: Home / Self Care | Payer: Medicare Other | Source: Ambulatory Visit | Attending: Family Medicine | Admitting: Family Medicine

## 2016-03-18 DIAGNOSIS — Z1231 Encounter for screening mammogram for malignant neoplasm of breast: Secondary | ICD-10-CM

## 2016-05-08 ENCOUNTER — Encounter: Payer: Self-pay | Admitting: Emergency Medicine

## 2016-05-08 ENCOUNTER — Ambulatory Visit
Admission: EM | Admit: 2016-05-08 | Discharge: 2016-05-08 | Disposition: A | Discharge status: Home / Self Care | Payer: Medicare Other | Attending: Family Medicine | Admitting: Family Medicine

## 2016-05-08 DIAGNOSIS — J209 Acute bronchitis, unspecified: Secondary | ICD-10-CM

## 2016-05-08 MED ORDER — PREDNISONE 10 MG (21) PO TBPK: ORAL_TABLET | ORAL | Status: DC

## 2016-05-08 MED ORDER — FLUTICASONE-SALMETEROL 100-50 MCG/DOSE IN AEPB: 1.0000 | INHALATION_SPRAY | Freq: Two times a day (BID) | RESPIRATORY_TRACT | Status: AC

## 2016-05-08 NOTE — ED Provider Notes (Signed)
CSN: [id number]     Arrival date & time 05/08/16  0807 History   First MD Initiated Contact with Patient 05/08/16 [phone]    Nurses notes were reviewed. Chief Complaint  Patient presents with  . Cough    Patient reports coughing. She states she will back the beach with a niece who has a history of COPD and asthma who is coughing extensively in the car yesterday. She reports waking up this morning with shortness of breath and bronchospasms and wheezing present. She tried to use her Advair Diskus but was out. She wound up using her husband's combination inhaler which seemed to help. She came in today to get not this out she states that she doesn't have something to knock this out is going get worse. And she needs a refill of her Advair discus. She thinks she is on the 50/100 inhaler but she is not completely sure. She states that when she uses her Advair and keeps her up and keeps her awake. The cough is nonproductive at this time.  Past history medical history hypertension GERD. She's had abdominal hysterectomy cholecystectomy tonsillectomy and appendectomy. Family medical history positive for heart failure in father and breast cancer maternal aunt and she never smoked. He is allergic to Demerol Levaquin Macrobid and moxifloxacin.  (Consider location/radiation/quality/duration/timing/severity/associated sxs/prior Treatment) Patient is a 82 y.o. female presenting with cough. The history is provided by the patient. No language interpreter was used.  Cough Cough characteristics:  Non-productive and hoarse Severity:  Moderate Onset quality:  Sudden Timing:  Intermittent Progression:  Waxing and waning Chronicity:  New Smoker: no   Context: upper respiratory infection   Relieved by:  Nothing Worsened by:  Nothing tried Associated symptoms: shortness of breath     Past Medical History  Diagnosis Date  . Edema   . Hypertension   . GERD (gastroesophageal reflux disease)    Past Surgical History   Procedure Laterality Date  . Appendectomy    . Tonsillectomy    . Gallbladder surgery    . Abdominal hysterectomy    . Cholecystectomy     Family History  Problem Relation Age of Onset  . Heart failure Father   . Breast cancer Maternal Aunt    Social History  Substance Use Topics  . Smoking status: Never Smoker   . Smokeless tobacco: Never Used  . Alcohol Use: No   OB History    No data available     Review of Systems  Respiratory: Positive for cough and shortness of breath.   All other systems reviewed and are negative.   Allergies  Macrobid; Demerol; Levaquin; and Moxifloxacin  Home Medications   Prior to Admission medications   Medication Sig Start Date End Date Taking? Authorizing Provider  albuterol (PROVENTIL HFA;VENTOLIN HFA) 108 (90 Base) MCG/ACT inhaler Inhale 2 puffs into the lungs every 4 (four) hours as needed for wheezing or shortness of breath.   Yes Historical Provider, MD  amoxicillin-clavulanate (AUGMENTIN) 875-125 MG tablet Take 1 tablet by mouth every 12 (twelve) hours. 11/19/15   Barbaraann Barthel, NP  cetirizine (ZYRTEC) 10 MG tablet Take 10 mg by mouth daily.    Historical Provider, MD  Fluticasone-Salmeterol (ADVAIR DISKUS) 100-50 MCG/DOSE AEPB Inhale 1 puff into the lungs 2 (two) times daily. 05/08/16   Hassan Rowan, MD  hydrochlorothiazide (HYDRODIURIL) 25 MG tablet Take 25 mg by mouth daily.    Historical Provider, MD  ipratropium-albuterol (DUONEB) 0.5-2.5 (3) MG/3ML SOLN Take 3 mLs by nebulization every  6 (six) hours as needed. 11/19/15 11/23/15  Barbaraann Barthelina A Betancourt, NP  methenamine (HIPREX) 1 G tablet Take 1 g by mouth 2 (two) times daily with a meal.    Historical Provider, MD  potassium chloride SA (K-DUR,KLOR-CON) 20 MEQ tablet Take 20 mEq by mouth once.    Historical Provider, MD  predniSONE (STERAPRED UNI-PAK 21 TAB) 10 MG (21) TBPK tablet Sig 6 tablet day 1, 5 tablets day 2, 4 tablets day 3,,3tablets day 4, 2 tablets day 5, 1 tablet day 6  take all tablets orally 05/08/16   Hassan RowanEugene Lumina Gitto, MD  ranitidine (ZANTAC) 150 MG capsule Take 150 mg by mouth every evening.    Historical Provider, MD  sodium chloride (OCEAN) 0.65 % SOLN nasal spray Place 2 sprays into both nostrils every 2 (two) hours while awake. 11/19/15   Barbaraann Barthelina A Betancourt, NP  vitamin C (ASCORBIC ACID) 500 MG tablet Take 500 mg by mouth daily.    Historical Provider, MD   Meds Ordered and Administered this Visit  Medications - No data to display  BP 132/65 mmHg  Pulse 63  Temp(Src) 97.4 F (36.3 C) (Tympanic)  Resp 16  Ht 5\' 3"  (1.6 m)  Wt 150 lb (68.04 kg)  BMI 26.58 kg/m2  SpO2 99% No data found.   Physical Exam  Constitutional: She is oriented to person, place, and time. She appears well-developed and well-nourished.  HENT:  Head: Normocephalic and atraumatic.  Right Ear: External ear normal.  Left Ear: External ear normal.  Eyes: Pupils are equal, round, and reactive to light.  Neck: Normal range of motion. Neck supple.  Cardiovascular: Normal rate and regular rhythm.   Pulmonary/Chest: Effort normal. She has wheezes.  Musculoskeletal: Normal range of motion.  Neurological: She is alert and oriented to person, place, and time. No cranial nerve deficit.  Skin: Skin is warm and dry. No erythema.  Psychiatric: She has a normal mood and affect.  Vitals reviewed.   ED Course  Procedures (including critical care time)  Labs Review Labs Reviewed - No data to display  Imaging Review No results found.   Visual Acuity Review  Right Eye Distance:   Left Eye Distance:   Bilateral Distance:    Right Eye Near:   Left Eye Near:    Bilateral Near:         MDM   1. Acute bronchitis with bronchospasm    Offered patient a breathing treatment here since she does have some wheezes she declined. States she'll use her nebulizer when she gets home. She is wants refill for Advair and supplemental not to cough out and the congestion out. She also  declined any type of cough medicine. I'll place on prednisone for 6 days since this apparently is a bowel infection that caused bronchospasm and renew her Advair Diskus.. But this time and box should not and do not appear to be indicated.    Hassan RowanEugene Januel Doolan, MD 05/08/16 [phone]

## 2016-05-08 NOTE — ED Notes (Signed)
Patient c/o cough and chest congestion since yesterday.  

## 2016-05-08 NOTE — Discharge Instructions (Signed)

## 2017-08-10 ENCOUNTER — Other Ambulatory Visit: Payer: Self-pay | Admitting: Family Medicine

## 2017-08-10 DIAGNOSIS — Z1231 Encounter for screening mammogram for malignant neoplasm of breast: Secondary | ICD-10-CM

## 2017-08-18 ENCOUNTER — Ambulatory Visit
Admission: RE | Admit: 2017-08-18 | Discharge: 2017-08-18 | Disposition: A | Discharge status: Home / Self Care | Payer: Medicare Other | Source: Ambulatory Visit | Attending: Family Medicine | Admitting: Family Medicine

## 2017-08-18 DIAGNOSIS — Z1231 Encounter for screening mammogram for malignant neoplasm of breast: Secondary | ICD-10-CM | POA: Insufficient documentation

## 2017-11-12 ENCOUNTER — Other Ambulatory Visit: Payer: Self-pay

## 2017-11-12 ENCOUNTER — Ambulatory Visit
Admission: EM | Admit: 2017-11-12 | Discharge: 2017-11-12 | Disposition: A | Discharge status: Home / Self Care | Payer: Medicare Other | Attending: Family Medicine | Admitting: Family Medicine

## 2017-11-12 DIAGNOSIS — Z9889 Other specified postprocedural states: Secondary | ICD-10-CM | POA: Diagnosis not present

## 2017-11-12 DIAGNOSIS — N390 Urinary tract infection, site not specified: Secondary | ICD-10-CM | POA: Diagnosis not present

## 2017-11-12 DIAGNOSIS — Z90710 Acquired absence of both cervix and uterus: Secondary | ICD-10-CM | POA: Diagnosis not present

## 2017-11-12 DIAGNOSIS — Z881 Allergy status to other antibiotic agents status: Secondary | ICD-10-CM | POA: Diagnosis not present

## 2017-11-12 DIAGNOSIS — R319 Hematuria, unspecified: Secondary | ICD-10-CM | POA: Insufficient documentation

## 2017-11-12 DIAGNOSIS — Z79899 Other long term (current) drug therapy: Secondary | ICD-10-CM | POA: Diagnosis not present

## 2017-11-12 DIAGNOSIS — Z9049 Acquired absence of other specified parts of digestive tract: Secondary | ICD-10-CM | POA: Diagnosis not present

## 2017-11-12 DIAGNOSIS — I10 Essential (primary) hypertension: Secondary | ICD-10-CM | POA: Diagnosis not present

## 2017-11-12 LAB — URINALYSIS, COMPLETE (UACMP) WITH MICROSCOPIC
Bilirubin Urine: NEGATIVE
GLUCOSE, UA: NEGATIVE mg/dL
HGB URINE DIPSTICK: NEGATIVE
Ketones, ur: NEGATIVE mg/dL
Nitrite: POSITIVE — AB
Protein, ur: NEGATIVE mg/dL
SPECIFIC GRAVITY, URINE: 1.02 (ref 1.005–1.030)
pH: 7.5 (ref 5.0–8.0)

## 2017-11-12 MED ORDER — SULFAMETHOXAZOLE-TRIMETHOPRIM 800-160 MG PO TABS: 1.0000 | ORAL_TABLET | Freq: Two times a day (BID) | ORAL | 0 refills | Status: DC

## 2017-11-12 NOTE — ED Triage Notes (Signed)
Onset yesterday hx of recurrent UTI

## 2017-11-12 NOTE — ED Provider Notes (Signed)
[facility]    CSN: [id number] Arrival date & time: 11/12/17  0818     History   Chief Complaint Chief Complaint  Patient presents with  . Recurrent UTI    HPI Desiree Keith is a 83 y.o. female.   The history is provided by the patient.  Dysuria  Pain quality:  Burning Pain severity:  Mild Onset quality:  Sudden Duration:  2 days Timing:  Constant Progression:  Worsening Chronicity:  New Recent urinary tract infections: yes   Relieved by:  Nothing Ineffective treatments:  None tried Urinary symptoms: frequent urination and hesitancy   Associated symptoms: no abdominal pain, no fever, no flank pain, no nausea, no vaginal discharge and no vomiting   Risk factors: recurrent urinary tract infections   Risk factors: no hx of pyelonephritis, no hx of urolithiasis, no kidney transplant, not pregnant, no renal cysts, no renal disease, no single kidney and no urinary catheter     Past Medical History:  Diagnosis Date  . Edema   . GERD (gastroesophageal reflux disease)   . Hypertension     There are no active problems to display for this patient.   Past Surgical History:  Procedure Laterality Date  . ABDOMINAL HYSTERECTOMY    . APPENDECTOMY    . CHOLECYSTECTOMY    . GALLBLADDER SURGERY    . TONSILLECTOMY      OB History    No data available       Home Medications    Prior to Admission medications   Medication Sig Start Date End Date Taking? Authorizing Provider  albuterol (PROVENTIL HFA;VENTOLIN HFA) 108 (90 Base) MCG/ACT inhaler Inhale 2 puffs into the lungs every 4 (four) hours as needed for wheezing or shortness of breath.   Yes [provider]  cetirizine (ZYRTEC) 10 MG tablet Take 10 mg by mouth daily.   Yes [provider]  Fluticasone-Salmeterol (ADVAIR DISKUS) 100-50 MCG/DOSE AEPB Inhale 1 puff into the lungs 2 (two) times daily. 05/08/16  Yes Hassan RowanWade, Eugene, MD  hydrochlorothiazide (HYDRODIURIL) 25 MG tablet Take 25 mg  by mouth daily.   Yes [provider]  potassium chloride SA (K-DUR,KLOR-CON) 20 MEQ tablet Take 20 mEq by mouth once.   Yes [provider]  ranitidine (ZANTAC) 150 MG capsule Take 150 mg by mouth every evening.   Yes [provider]  sodium chloride (OCEAN) 0.65 % SOLN nasal spray Place 2 sprays into both nostrils every 2 (two) hours while awake. 11/19/15  Yes Betancourt, Jarold Songina A, NP  vitamin C (ASCORBIC ACID) 500 MG tablet Take 500 mg by mouth daily.   Yes [provider]  amoxicillin-clavulanate (AUGMENTIN) 875-125 MG tablet Take 1 tablet by mouth every 12 (twelve) hours. 11/19/15   Betancourt, Jarold Songina A, NP  ipratropium-albuterol (DUONEB) 0.5-2.5 (3) MG/3ML SOLN Take 3 mLs by nebulization every 6 (six) hours as needed. 11/19/15 11/23/15  Betancourt, Jarold Songina A, NP  methenamine (HIPREX) 1 G tablet Take 1 g by mouth 2 (two) times daily with a meal.    [provider]  predniSONE (STERAPRED UNI-PAK 21 TAB) 10 MG (21) TBPK tablet Sig 6 tablet day 1, 5 tablets day 2, 4 tablets day 3,,3tablets day 4, 2 tablets day 5, 1 tablet day 6 take all tablets orally 05/08/16   Hassan RowanWade, Eugene, MD  sulfamethoxazole-trimethoprim (BACTRIM DS,SEPTRA DS) 800-160 MG tablet Take 1 tablet 2 (two) times daily by mouth. 11/12/17   Payton Mccallumonty, Sayf Kerner, MD    Family History Family History  Problem Relation Age of Onset  . Heart failure Father   . Breast cancer Maternal Aunt     Social History Social History   Tobacco Use  . Smoking status: Never Smoker  . Smokeless tobacco: Never Used  Substance Use Topics  . Alcohol use: No  . Drug use: No     Allergies   Macrobid [nitrofurantoin monohyd macro]; Demerol [meperidine]; Levaquin [levofloxacin]; Microbial antigen; and Moxifloxacin   Review of Systems Review of Systems  Constitutional: Negative for fever.  Gastrointestinal: Negative for abdominal pain, nausea and vomiting.  Genitourinary: Positive for dysuria. Negative for  flank pain and vaginal discharge.     Physical Exam Triage Vital Signs ED Triage Vitals  Enc Vitals Group     BP 11/12/17 0830 (!) 143/82     Pulse Rate 11/12/17 0830 65     Resp 11/12/17 0830 16     Temp 11/12/17 0830 98.3 F (36.8 C)     Temp Source 11/12/17 0830 Oral     SpO2 11/12/17 0830 99 %     Weight 11/12/17 0826 155 lb (70.3 kg)     Height 11/12/17 0826 5\' 3"  (1.6 m)     Head Circumference --      Peak Flow --      Pain Score 11/12/17 0826 5     Pain Loc --      Pain Edu? --      Excl. in GC? --    No data found.  Updated Vital Signs BP (!) 143/82 (BP Location: Right Arm)   Pulse 65   Temp 98.3 F (36.8 C) (Oral)   Resp 16   Ht 5\' 3"  (1.6 m)   Wt 155 lb (70.3 kg)   SpO2 99%   BMI 27.46 kg/m   Visual Acuity Right Eye Distance:   Left Eye Distance:   Bilateral Distance:    Right Eye Near:   Left Eye Near:    Bilateral Near:     Physical Exam  Constitutional: She appears well-developed and well-nourished. No distress.  Abdominal: Soft. Bowel sounds are normal. She exhibits no distension and no mass. There is no tenderness. There is no rebound and no guarding.  Skin: She is not diaphoretic.  Nursing note and vitals reviewed.    UC Treatments / Results  Labs (all labs ordered are listed, but only abnormal results are displayed) Labs Reviewed  URINALYSIS, COMPLETE (UACMP) WITH MICROSCOPIC - Abnormal; Notable for the following components:      Result Value   APPearance CLOUDY (*)    Nitrite POSITIVE (*)    Leukocytes, UA SMALL (*)    Squamous Epithelial / LPF 0-5 (*)    Bacteria, UA MANY (*)    All other components within normal limits  URINE CULTURE    EKG  EKG Interpretation None       Radiology No results found.  Procedures Procedures (including critical care time)  Medications Ordered in UC Medications - No data to display   Initial Impression / Assessment and Plan / UC Course  I have reviewed the triage vital signs and  the nursing notes.  Pertinent labs & imaging results that were available during my care of the patient were reviewed by me and considered in my medical decision making (see chart for details).       Final Clinical Impressions(s) / UC Diagnoses   Final diagnoses:  Urinary tract infection with hematuria, site unspecified    ED Discharge Orders  Ordered    sulfamethoxazole-trimethoprim (BACTRIM DS,SEPTRA DS) 800-160 MG tablet  2 times daily     11/12/17 0912     1. Lab results and diagnosis reviewed with patient 2. rx as per orders above; reviewed possible side effects, interactions, risks and benefits  3. Recommend supportive treatment with increased water intake 4. Check urine culture 5. Follow-up with urologist 6. Follow up  prn if symptoms worsen or don't improve  Controlled Substance Prescriptions [place] Controlled Substance Registry consulted? Not Applicable   Payton Mccallumonty, Camara Renstrom, MD 11/12/17 [phone]

## 2017-11-14 LAB — URINE CULTURE
Culture: >=100000 — AB
Special Requests: NORMAL

## 2017-12-04 ENCOUNTER — Ambulatory Visit
Admission: EM | Admit: 2017-12-04 | Discharge: 2017-12-04 | Disposition: A | Discharge status: Home / Self Care | Payer: Medicare Other | Attending: Emergency Medicine | Admitting: Emergency Medicine

## 2017-12-04 ENCOUNTER — Other Ambulatory Visit: Payer: Self-pay

## 2017-12-04 DIAGNOSIS — R030 Elevated blood-pressure reading, without diagnosis of hypertension: Secondary | ICD-10-CM | POA: Diagnosis not present

## 2017-12-04 DIAGNOSIS — R319 Hematuria, unspecified: Secondary | ICD-10-CM | POA: Diagnosis not present

## 2017-12-04 DIAGNOSIS — N390 Urinary tract infection, site not specified: Secondary | ICD-10-CM | POA: Diagnosis not present

## 2017-12-04 DIAGNOSIS — R300 Dysuria: Secondary | ICD-10-CM

## 2017-12-04 LAB — URINALYSIS, COMPLETE (UACMP) WITH MICROSCOPIC
BILIRUBIN URINE: NEGATIVE
GLUCOSE, UA: 100 mg/dL — AB
HGB URINE DIPSTICK: NEGATIVE
KETONES UR: NEGATIVE mg/dL
NITRITE: POSITIVE — AB
PH: 7 (ref 5.0–8.0)
PROTEIN: NEGATIVE mg/dL
Specific Gravity, Urine: 1.015 (ref 1.005–1.030)

## 2017-12-04 MED ORDER — CEPHALEXIN 500 MG PO CAPS: 500.0000 mg | ORAL_CAPSULE | Freq: Three times a day (TID) | ORAL | 0 refills | Status: AC

## 2017-12-04 NOTE — ED Triage Notes (Signed)
Patient complains of urinary frequency and urgency with painful urination that started last night. Patient states that she did take AZO last night.

## 2017-12-04 NOTE — ED Provider Notes (Signed)
[facility]    CSN: [id number] Arrival date & time: 12/04/17  0808     History   Chief Complaint Chief Complaint  Patient presents with  . Dysuria    HPI Desiree Keith is a 83 y.o. female.   83 yr old white female presents to UC with 1 day sudden onset dysuria, hx of recurrent UTI's most recently last month, pt reports has urologist, Dr Trena Platt,  appt set up for 02/09/18 (first available). Pt denies fever, flank pain, N,V, or abdominal pain.    The history is provided by the patient. No language interpreter was used.  Dysuria  Pain quality:  Burning Pain severity:  Moderate Onset quality:  Sudden Timing:  Constant Progression:  Unchanged Chronicity:  Recurrent (last UTI 11/18) Recent urinary tract infections: yes   Relieved by: azo. Worsened by:  Nothing Ineffective treatments: water. Urinary symptoms: discolored urine and frequent urination   Associated symptoms: no abdominal pain, no fever, no flank pain, no genital lesions, no nausea, no vaginal discharge and no vomiting   Risk factors: recurrent urinary tract infections     Past Medical History:  Diagnosis Date  . Edema   . GERD (gastroesophageal reflux disease)   . Hypertension     Patient Active Problem List   Diagnosis Date Noted  . Dysuria 12/04/2017  . Urinary tract infection with hematuria 12/04/2017  . Elevated blood pressure reading 12/04/2017    Past Surgical History:  Procedure Laterality Date  . ABDOMINAL HYSTERECTOMY    . APPENDECTOMY    . CHOLECYSTECTOMY    . GALLBLADDER SURGERY    . TONSILLECTOMY      OB History    No data available       Home Medications    Prior to Admission medications   Medication Sig Start Date End Date Taking? Authorizing Provider  albuterol (PROVENTIL HFA;VENTOLIN HFA) 108 (90 Base) MCG/ACT inhaler Inhale 2 puffs into the lungs every 4 (four) hours as needed for wheezing or shortness of breath.   Yes [provider]  cetirizine  (ZYRTEC) 10 MG tablet Take 10 mg by mouth daily.   Yes [provider]  Fluticasone-Salmeterol (ADVAIR DISKUS) 100-50 MCG/DOSE AEPB Inhale 1 puff into the lungs 2 (two) times daily. 05/08/16  Yes Hassan Rowan, MD  hydrochlorothiazide (HYDRODIURIL) 25 MG tablet Take 25 mg by mouth daily.   Yes [provider]  methenamine (HIPREX) 1 G tablet Take 1 g by mouth 2 (two) times daily with a meal.   Yes [provider]  potassium chloride SA (K-DUR,KLOR-CON) 20 MEQ tablet Take 20 mEq by mouth once.   Yes [provider]  ranitidine (ZANTAC) 150 MG capsule Take 150 mg by mouth every evening.   Yes [provider]  vitamin C (ASCORBIC ACID) 500 MG tablet Take 500 mg by mouth daily.   Yes [provider]  amoxicillin-clavulanate (AUGMENTIN) 875-125 MG tablet Take 1 tablet by mouth every 12 (twelve) hours. 11/19/15   Betancourt, Jarold Song, NP  cephALEXin (KEFLEX) 500 MG capsule Take 1 capsule (500 mg total) by mouth 3 (three) times daily for 10 days. 12/04/17 12/14/17  Lani Havlik, Para March, NP  ipratropium-albuterol (DUONEB) 0.5-2.5 (3) MG/3ML SOLN Take 3 mLs by nebulization every 6 (six) hours as needed. 11/19/15 11/23/15  Betancourt, Jarold Song, NP  predniSONE (STERAPRED UNI-PAK 21 TAB) 10 MG (21) TBPK tablet Sig 6 tablet day 1, 5 tablets day 2, 4 tablets day 3,,3tablets day 4, 2 tablets day 5,  1 tablet day 6 take all tablets orally 05/08/16   Hassan RowanWade, Eugene, MD  sodium chloride (OCEAN) 0.65 % SOLN nasal spray Place 2 sprays into both nostrils every 2 (two) hours while awake. 11/19/15   Betancourt, Jarold Songina A, NP  sulfamethoxazole-trimethoprim (BACTRIM DS,SEPTRA DS) 800-160 MG tablet Take 1 tablet 2 (two) times daily by mouth. 11/12/17   Payton Mccallumonty, Orlando, MD    Family History Family History  Problem Relation Age of Onset  . Heart failure Father   . Breast cancer Maternal Aunt     Social History Social History   Tobacco Use  . Smoking status: Never Smoker  .  Smokeless tobacco: Never Used  Substance Use Topics  . Alcohol use: No  . Drug use: No     Allergies   Macrobid [nitrofurantoin monohyd macro]; Demerol [meperidine]; Levaquin [levofloxacin]; Microbial antigen; and Moxifloxacin   Review of Systems Review of Systems  Constitutional: Negative for fever.  HENT: Negative.   Eyes: Negative.   Respiratory: Negative.   Cardiovascular: Negative.   Gastrointestinal: Negative for abdominal pain, nausea and vomiting.  Endocrine: Negative.   Genitourinary: Positive for dysuria and frequency. Negative for flank pain and vaginal discharge.  Musculoskeletal: Negative for back pain.  Skin: Negative for rash.  Allergic/Immunologic: Negative.   Neurological: Negative.   Hematological: Negative.   Psychiatric/Behavioral: Negative.   All other systems reviewed and are negative.    Physical Exam Triage Vital Signs ED Triage Vitals  Enc Vitals Group     BP 12/04/17 0825 (!) 147/69     Pulse Rate 12/04/17 0825 70     Resp 12/04/17 0825 18     Temp 12/04/17 0825 98.3 F (36.8 C)     Temp Source 12/04/17 0825 Oral     SpO2 12/04/17 0825 99 %     Weight 12/04/17 0823 153 lb (69.4 kg)     Height 12/04/17 0823 5\' 3"  (1.6 m)     Head Circumference --      Peak Flow --      Pain Score 12/04/17 0823 5     Pain Loc --      Pain Edu? --      Excl. in GC? --    No data found.  Updated Vital Signs BP (!) 147/69 (BP Location: Right Arm)   Pulse 70   Temp 98.3 F (36.8 C) (Oral)   Resp 18   Ht 5\' 3"  (1.6 m)   Wt 153 lb (69.4 kg)   SpO2 99%   BMI 27.10 kg/m    Physical Exam  Constitutional: She is oriented to Keith, place, and time. Vital signs are normal. She appears well-developed and well-nourished. She is active. No distress.  HENT:  Head: Normocephalic.  Eyes: Pupils are equal, round, and reactive to light.  Neck: Normal range of motion.  Cardiovascular: Normal rate, regular rhythm and normal pulses.  Pulmonary/Chest: Effort  normal and breath sounds normal.  Abdominal: Normal appearance and bowel sounds are normal. There is no tenderness.  Musculoskeletal: Normal range of motion.  Neurological: She is alert and oriented to Keith, place, and time. GCS eye subscore is 4. GCS verbal subscore is 5. GCS motor subscore is 6.  Skin: Skin is warm and dry.  Psychiatric: She has a normal mood and affect. Her speech is normal and behavior is normal.  Nursing note and vitals reviewed.    UC Treatments / Results  Labs (all labs ordered are listed, but only abnormal results are displayed)  Labs Reviewed  URINALYSIS, COMPLETE (UACMP) WITH MICROSCOPIC - Abnormal; Notable for the following components:      Result Value   Color, Urine ORANGE (*)    APPearance CLOUDY (*)    Glucose, UA 100 (*)    Nitrite POSITIVE (*)    Leukocytes, UA MODERATE (*)    Squamous Epithelial / LPF 0-5 (*)    Bacteria, UA MANY (*)    All other components within normal limits  URINE CULTURE    EKG  EKG Interpretation None       Radiology No results found.  Procedures Procedures (including critical care time)  Medications Ordered in UC Medications - No data to display   Initial Impression / Assessment and Plan / UC Course  I have reviewed the triage vital signs and the nursing notes.  Pertinent labs & imaging results that were available during my care of the patient were reviewed by me and considered in my medical decision making (see chart for details).    Your UA today shows UTI, recently your urine culture grew out Klebsiella, we will culture your urine, treating with Keflex tid x 10 days, drink plenty of water. Have BP rechecked in 2 days by PCP. Please call urologist and see if they are able to see you any sooner than February d/t recurrent Uti's. Return to UC as needed. Go tot Er for new or worsening issues.   DDX: Urinary reflux, pyelo. Will treat with keflex since recently treated with Bactrim, UC pending.  Final  Clinical Impressions(s) / UC Diagnoses   Final diagnoses:  Dysuria  Urinary tract infection with hematuria, site unspecified  Elevated blood pressure reading    ED Discharge Orders        Ordered    cephALEXin (KEFLEX) 500 MG capsule  3 times daily     12/04/17 0855       Controlled Substance Prescriptions    Melrose Kearse, Para MarchJeanette, NP 12/04/17 [phone]

## 2017-12-04 NOTE — Discharge Instructions (Signed)
Your UA today shows UTI, recently your urine culture grew out Klebsiella, we will culture your urine, treating with Keflex tid x 10 days, drink plenty of water. Have BP rechecked in 2 days by PCP. Please call urologist and see if they are able to see you any sooner than February d/t recurrent Uti's. Return to UC as needed. Go tot Er for new or worsening issues.

## 2017-12-07 LAB — URINE CULTURE: Culture: >=100000 — AB

## 2018-01-05 ENCOUNTER — Other Ambulatory Visit: Payer: Self-pay | Admitting: Family Medicine

## 2018-01-05 DIAGNOSIS — Z780 Asymptomatic menopausal state: Secondary | ICD-10-CM

## 2018-01-13 ENCOUNTER — Ambulatory Visit
Admission: RE | Admit: 2018-01-13 | Discharge: 2018-01-13 | Disposition: A | Discharge status: Home / Self Care | Payer: Medicare Other | Source: Ambulatory Visit | Attending: Family Medicine | Admitting: Family Medicine

## 2018-01-13 DIAGNOSIS — Z780 Asymptomatic menopausal state: Secondary | ICD-10-CM | POA: Insufficient documentation

## 2018-01-13 DIAGNOSIS — M85852 Other specified disorders of bone density and structure, left thigh: Secondary | ICD-10-CM | POA: Insufficient documentation

## 2018-01-13 DIAGNOSIS — J449 Chronic obstructive pulmonary disease, unspecified: Secondary | ICD-10-CM | POA: Insufficient documentation

## 2018-08-12 ENCOUNTER — Ambulatory Visit
Admission: RE | Admit: 2018-08-12 | Discharge: 2018-08-12 | Disposition: A | Discharge status: Home / Self Care | Payer: Medicare Other | Source: Ambulatory Visit | Attending: Unknown Physician Specialty | Admitting: Unknown Physician Specialty

## 2018-08-12 ENCOUNTER — Ambulatory Visit: Payer: Medicare Other | Admitting: Anesthesiology

## 2018-08-12 ENCOUNTER — Encounter
Admission: RE | Disposition: A | Discharge status: Home / Self Care | Payer: Self-pay | Source: Ambulatory Visit | Attending: Unknown Physician Specialty

## 2018-08-12 DIAGNOSIS — Z90710 Acquired absence of both cervix and uterus: Secondary | ICD-10-CM | POA: Insufficient documentation

## 2018-08-12 DIAGNOSIS — G5601 Carpal tunnel syndrome, right upper limb: Secondary | ICD-10-CM | POA: Diagnosis present

## 2018-08-12 DIAGNOSIS — G5603 Carpal tunnel syndrome, bilateral upper limbs: Secondary | ICD-10-CM | POA: Insufficient documentation

## 2018-08-12 DIAGNOSIS — Z8249 Family history of ischemic heart disease and other diseases of the circulatory system: Secondary | ICD-10-CM | POA: Diagnosis not present

## 2018-08-12 DIAGNOSIS — E7800 Pure hypercholesterolemia, unspecified: Secondary | ICD-10-CM | POA: Insufficient documentation

## 2018-08-12 DIAGNOSIS — Z9049 Acquired absence of other specified parts of digestive tract: Secondary | ICD-10-CM | POA: Insufficient documentation

## 2018-08-12 DIAGNOSIS — H9113 Presbycusis, bilateral: Secondary | ICD-10-CM | POA: Diagnosis not present

## 2018-08-12 DIAGNOSIS — M1990 Unspecified osteoarthritis, unspecified site: Secondary | ICD-10-CM | POA: Diagnosis not present

## 2018-08-12 DIAGNOSIS — Z83511 Family history of glaucoma: Secondary | ICD-10-CM | POA: Diagnosis not present

## 2018-08-12 DIAGNOSIS — Z9842 Cataract extraction status, left eye: Secondary | ICD-10-CM | POA: Diagnosis not present

## 2018-08-12 DIAGNOSIS — Z888 Allergy status to other drugs, medicaments and biological substances status: Secondary | ICD-10-CM | POA: Diagnosis not present

## 2018-08-12 DIAGNOSIS — I10 Essential (primary) hypertension: Secondary | ICD-10-CM | POA: Diagnosis not present

## 2018-08-12 DIAGNOSIS — K219 Gastro-esophageal reflux disease without esophagitis: Secondary | ICD-10-CM | POA: Diagnosis not present

## 2018-08-12 DIAGNOSIS — J45909 Unspecified asthma, uncomplicated: Secondary | ICD-10-CM | POA: Diagnosis not present

## 2018-08-12 DIAGNOSIS — Z881 Allergy status to other antibiotic agents status: Secondary | ICD-10-CM | POA: Insufficient documentation

## 2018-08-12 DIAGNOSIS — H547 Unspecified visual loss: Secondary | ICD-10-CM | POA: Insufficient documentation

## 2018-08-12 DIAGNOSIS — Z7951 Long term (current) use of inhaled steroids: Secondary | ICD-10-CM | POA: Insufficient documentation

## 2018-08-12 DIAGNOSIS — Z9841 Cataract extraction status, right eye: Secondary | ICD-10-CM | POA: Insufficient documentation

## 2018-08-12 DIAGNOSIS — Z87440 Personal history of urinary (tract) infections: Secondary | ICD-10-CM | POA: Insufficient documentation

## 2018-08-12 DIAGNOSIS — J449 Chronic obstructive pulmonary disease, unspecified: Secondary | ICD-10-CM | POA: Diagnosis not present

## 2018-08-12 DIAGNOSIS — Z885 Allergy status to narcotic agent status: Secondary | ICD-10-CM | POA: Insufficient documentation

## 2018-08-12 DIAGNOSIS — Z820 Family history of epilepsy and other diseases of the nervous system: Secondary | ICD-10-CM | POA: Insufficient documentation

## 2018-08-12 DIAGNOSIS — H9190 Unspecified hearing loss, unspecified ear: Secondary | ICD-10-CM | POA: Diagnosis not present

## 2018-08-12 DIAGNOSIS — Z8349 Family history of other endocrine, nutritional and metabolic diseases: Secondary | ICD-10-CM | POA: Insufficient documentation

## 2018-08-12 DIAGNOSIS — Z79899 Other long term (current) drug therapy: Secondary | ICD-10-CM | POA: Diagnosis not present

## 2018-08-12 HISTORY — DX: Presence of external hearing-aid: Z97.4

## 2018-08-12 HISTORY — DX: Migraine, unspecified, not intractable, without status migrainosus: G43.909

## 2018-08-12 HISTORY — DX: Unspecified osteoarthritis, unspecified site: M19.90

## 2018-08-12 HISTORY — PX: CARPAL TUNNEL RELEASE: SHX101

## 2018-08-12 HISTORY — DX: Unspecified asthma, uncomplicated: J45.909

## 2018-08-12 HISTORY — DX: Motion sickness, initial encounter: T75.3XXA

## 2018-08-12 SURGERY — CARPAL TUNNEL RELEASE
Anesthesia: General | Laterality: Right | Wound class: Clean

## 2018-08-12 MED ORDER — GLYCOPYRROLATE 0.2 MG/ML IJ SOLN
INTRAMUSCULAR | Status: DC | PRN
  Administered 2018-08-12: 0.1 mg via INTRAVENOUS

## 2018-08-12 MED ORDER — NORCO 5-325 MG PO TABS: 1.0000 | ORAL_TABLET | Freq: Four times a day (QID) | ORAL | 0 refills | Status: AC | PRN

## 2018-08-12 MED ORDER — ROPIVACAINE HCL 5 MG/ML IJ SOLN
INTRAMUSCULAR | Status: DC | PRN
  Administered 2018-08-12: 25 mL via EPIDURAL

## 2018-08-12 MED ORDER — LACTATED RINGERS IV SOLN
INTRAVENOUS | Status: DC | PRN
  Administered 2018-08-12: 09:00:00 via INTRAVENOUS

## 2018-08-12 MED ORDER — MIDAZOLAM HCL 5 MG/5ML IJ SOLN
INTRAMUSCULAR | Status: DC | PRN
  Administered 2018-08-12: 0.5 mg via INTRAVENOUS

## 2018-08-12 MED ORDER — ONDANSETRON HCL 4 MG/2ML IJ SOLN
INTRAMUSCULAR | Status: DC | PRN
  Administered 2018-08-12: 4 mg via INTRAVENOUS

## 2018-08-12 MED ORDER — DEXAMETHASONE SODIUM PHOSPHATE 4 MG/ML IJ SOLN
INTRAMUSCULAR | Status: DC | PRN
  Administered 2018-08-12: 4 mg via INTRAVENOUS

## 2018-08-12 MED ORDER — FENTANYL CITRATE (PF) 100 MCG/2ML IJ SOLN
50.0000 ug | INTRAMUSCULAR | Status: AC | PRN
  Administered 2018-08-12: 25 ug via INTRAVENOUS

## 2018-08-12 MED ORDER — LIDOCAINE HCL (CARDIAC) PF 100 MG/5ML IV SOSY
PREFILLED_SYRINGE | INTRAVENOUS | Status: DC | PRN
  Administered 2018-08-12: 10 mg via INTRATRACHEAL

## 2018-08-12 MED ORDER — PROPOFOL 10 MG/ML IV BOLUS
INTRAVENOUS | Status: DC | PRN
  Administered 2018-08-12: 60 mg via INTRAVENOUS
  Administered 2018-08-12: 80 mg via INTRAVENOUS

## 2018-08-12 SURGICAL SUPPLY — 26 items
BANDAGE ELASTIC 2 LF NS (GAUZE/BANDAGES/DRESSINGS) ×3 IMPLANT
BNDG ESMARK 4X12 TAN STRL LF (GAUZE/BANDAGES/DRESSINGS) ×3 IMPLANT
COVER LIGHT HANDLE FLEXIBLE (MISCELLANEOUS) ×6 IMPLANT
CUFF TOURN SGL QUICK 18 (TOURNIQUET CUFF) ×3 IMPLANT
DURAPREP 26ML APPLICATOR (WOUND CARE) ×3 IMPLANT
ELECT REM PT RETURN 9FT ADLT (ELECTROSURGICAL) ×3
ELECTRODE REM PT RTRN 9FT ADLT (ELECTROSURGICAL) ×1 IMPLANT
GAUZE SPONGE 4X4 12PLY STRL (GAUZE/BANDAGES/DRESSINGS) ×3 IMPLANT
GLOVE BIO SURGEON STRL SZ7.5 (GLOVE) ×3 IMPLANT
GLOVE BIO SURGEON STRL SZ8 (GLOVE) ×6 IMPLANT
GLOVE INDICATOR 8.0 STRL GRN (GLOVE) ×3 IMPLANT
GOWN STRL REUS W/ TWL LRG LVL3 (GOWN DISPOSABLE) ×2 IMPLANT
GOWN STRL REUS W/TWL LRG LVL3 (GOWN DISPOSABLE) ×4
KIT TURNOVER KIT A (KITS) ×3 IMPLANT
NS IRRIG 500ML POUR BTL (IV SOLUTION) ×3 IMPLANT
PACK EXTREMITY ARMC (MISCELLANEOUS) ×3 IMPLANT
PADDING CAST 2X4YD ST (MISCELLANEOUS) ×2
PADDING CAST BLEND 2X4 STRL (MISCELLANEOUS) ×1 IMPLANT
SOL PREP PVP 2OZ (MISCELLANEOUS) ×3
SOLUTION PREP PVP 2OZ (MISCELLANEOUS) ×1 IMPLANT
SPLINT CAST 1 STEP 3X12 (MISCELLANEOUS) ×3 IMPLANT
STOCKINETTE 4X48 STRL (DRAPES) ×3 IMPLANT
STRAP BODY AND KNEE 60X3 (MISCELLANEOUS) ×3 IMPLANT
SUT ETHILON 4-0 (SUTURE) ×2
SUT ETHILON 4-0 FS2 18XMFL BLK (SUTURE) ×1
SUTURE ETHLN 4-0 FS2 18XMF BLK (SUTURE) ×1 IMPLANT

## 2018-08-12 NOTE — Anesthesia Postprocedure Evaluation (Signed)
Anesthesia Post Note  Patient: Desiree Keith  Procedure(s) Performed: OPEN CARPAL TUNNEL RELEASE (Right )  Patient location during evaluation: PACU Anesthesia Type: General Level of consciousness: awake and alert Pain management: pain level controlled Vital Signs Assessment: post-procedure vital signs reviewed and stable Respiratory status: spontaneous breathing, nonlabored ventilation, respiratory function stable and patient connected to nasal cannula oxygen Cardiovascular status: blood pressure returned to baseline and stable Postop Assessment: no apparent nausea or vomiting Anesthetic complications: no    Quanell Loughney C

## 2018-08-12 NOTE — Transfer of Care (Signed)
Immediate Anesthesia Transfer of Care Note  Patient: Desiree Keith  Procedure(s) Performed: OPEN CARPAL TUNNEL RELEASE (Right )  Patient Location: PACU  Anesthesia Type: General  Level of Consciousness: awake, alert  and patient cooperative  Airway and Oxygen Therapy: Patient Spontanous Breathing and Patient connected to supplemental oxygen  Post-op Assessment: Post-op Vital signs reviewed, Patient's Cardiovascular Status Stable, Respiratory Function Stable, Patent Airway and No signs of Nausea or vomiting  Post-op Vital Signs: Reviewed and stable  Complications: No apparent anesthesia complications

## 2018-08-12 NOTE — Anesthesia Procedure Notes (Signed)
Procedure Name: LMA Insertion Date/Time: 08/12/2018 8:54 AM Performed by: Maree KrabbeWarren, Keyatta Tolles, CRNA Pre-anesthesia Checklist: Patient identified, Emergency Drugs available, Suction available, Timeout performed and Patient being monitored Patient Re-evaluated:Patient Re-evaluated prior to induction Oxygen Delivery Method: Circle system utilized Preoxygenation: Pre-oxygenation with 100% oxygen Induction Type: IV induction LMA: LMA inserted LMA Size: 3.0 Number of attempts: 1 Placement Confirmation: positive ETCO2 and breath sounds checked- equal and bilateral Tube secured with: Tape Dental Injury: Teeth and Oropharynx as per pre-operative assessment

## 2018-08-12 NOTE — Anesthesia Procedure Notes (Addendum)
Anesthesia Regional Block: Supraclavicular block   Pre-Anesthetic Checklist: ,, timeout performed, Correct Patient, Correct Site, Correct Laterality, Correct Procedure, Correct Position, site marked, Risks and benefits discussed,  Surgical consent,  Pre-op evaluation,  At surgeon's request and post-op pain management  Laterality: Right  Prep: chloraprep       Needles:  Injection technique: Single-shot  Needle Type: Echogenic Stimulator Needle      Needle Gauge: 21     Additional Needles:   Procedures:, nerve stimulator,,, ultrasound used (permanent image in chart),,,,   Nerve Stimulator or Paresthesia:  Response: bicep contraction, 0.45 mA,   Additional Responses:   Narrative:  Start time: 08/12/2018 8:22 AM End time: 08/12/2018 8:26 AM Injection made incrementally with aspirations every 5 mL.  Performed by: Personally   Additional Notes: Functioning IV was confirmed and monitors applied.  Sterile prep and drape,hand hygiene and sterile gloves were used.Ultrasound guidance: relevant anatomy identified, needle position confirmed, local anesthetic spread visualized around nerve(s)., vascular puncture avoided.  Image printed for medical record.  Negative aspiration and negative test dose prior to incremental administration of local anesthetic. The patient tolerated the procedure well. Vitals signes recorded in RN notes.

## 2018-08-12 NOTE — Op Note (Signed)
DATE OF SURGERY:  08/12/2018  PATIENT NAME:  Desiree Keith   DOB: 03/22/1932  MRN: [id number]  PRE-OPERATIVE DIAGNOSIS: Right carpal tunnel syndrome  POST-OPERATIVE DIAGNOSIS:  Same  PROCEDURE: Right carpal tunnel release  SURGEON: Dr. Erin SonsHarold Michon Kaczmarek, Montez HagemanJr. M.D.  ANESTHESIA: Supraclavicular block with general supplement  INDICATIONS FOR SURGERY: Desiree NailJoyce P Lina is a 84 y.o. year old female with a long history of numbness and paresthesias in the right hand. Nerve conduction studies demonstrated findings consistent with moderately severe median nerve compression.The patient had not seen any significant improvement despite conservative nonsurgical intervention. After discussion of the risks and benefits of surgical intervention, the patient expressed understanding of the risks benefits and agreed with plans for carpal tunnel release.   PROCEDURE IN DETAIL: The patient was taken the operating room where satisfactory general anesthesia was achieved. A tourniquet was placed on the patient's right upper arm.The right hand and arm were prepped  and draped in the usual sterile fashion. A "time-out" was performed as per usual protocol. The hand and forearm were exsanguinated using an Esmarch and the tourniquet was inflated to 250 mmHg.  An incision was made just ulnar to the thenar palmar crease. Dissection was carried down through the palmar fascia to the transverse carpal ligament. The transverse carpal ligament was sharply incised, taking care to protect the underlying structures within the carpal tunnel. Complete release of the transverse carpal ligament was achieved. There was no evidence of a mass or proliferative synovitis within the carpal tunnel. The median nerve did appear to be slightly flattened. The wound was irrigated with saline. The tourniquet was released at this time. It had been up for about 7 minutes. Bleeding was controlled with digital pressure and coagulation cautery.  The skin was then  re-approximated with interrupted sutures of #4-0 nylon. A sterile dressing was applied followed by application of a volar splint.  The patient was awakened and transferred to a stretcher bed.  The patient tolerated the procedure well and was transported to the PACU in stable condition. Blood loss was negligible.  Dr. Isidoro DonningHarold Symiah Nowotny, Jr. M.D.

## 2018-08-12 NOTE — Discharge Instructions (Signed)
General Anesthesia, Adult, Care After These instructions provide you with information about caring for yourself after your procedure. Your health care provider may also give you more specific instructions. Your treatment has been planned according to current medical practices, but problems sometimes occur. Call your health care provider if you have any problems or questions after your procedure. What can I expect after the procedure? After the procedure, it is common to have:  Vomiting.  A sore throat.  Mental slowness.  It is common to feel:  Nauseous.  Cold or shivery.  Sleepy.  Tired.  Sore or achy, even in parts of your body where you did not have surgery.  Follow these instructions at home: For at least 24 hours after the procedure:  Do not: ? Participate in activities where you could fall or become injured. ? Drive. ? Use heavy machinery. ? Drink alcohol. ? Take sleeping pills or medicines that cause drowsiness. ? Make important decisions or sign legal documents. ? Take care of children on your own.  Rest. Eating and drinking  If you vomit, drink water, juice, or soup when you can drink without vomiting.  Drink enough fluid to keep your urine clear or pale yellow.  Make sure you have little or no nausea before eating solid foods.  Follow the diet recommended by your health care provider. General instructions  Have a responsible adult stay with you until you are awake and alert.  Return to your normal activities as told by your health care provider. Ask your health care provider what activities are safe for you.  Take over-the-counter and prescription medicines only as told by your health care provider.  If you smoke, do not smoke without supervision.  Keep all follow-up visits as told by your health care provider. This is important. Contact a health care provider if:  You continue to have nausea or vomiting at home, and medicines are not helpful.  You  cannot drink fluids or start eating again.  You cannot urinate after 8-12 hours.  You develop a skin rash.  You have fever.  You have increasing redness at the site of your procedure. Get help right away if:  You have difficulty breathing.  You have chest pain.  You have unexpected bleeding.  You feel that you are having a life-threatening or urgent problem. This information is not intended to replace advice given to you by your health care provider. Make sure you discuss any questions you have with your health care provider. Document Released: 03/22/2001 Document Revised: 05/18/2016 Document Reviewed: 11/28/2015 Elsevier Interactive Patient Education  2018 [employer]  Ice pack  RTC in about 2 weeks  Keep dressing dry  Elevation

## 2018-08-12 NOTE — Anesthesia Preprocedure Evaluation (Addendum)
Anesthesia Evaluation    Airway Mallampati: II  TM Distance: >3 FB Neck ROM: Full    Dental no notable dental hx.    Pulmonary asthma ,    Pulmonary exam normal breath sounds clear to auscultation       Cardiovascular hypertension, Normal cardiovascular exam Rhythm:Regular Rate:Normal     Neuro/Psych  Headaches,    GI/Hepatic GERD  Medicated and Controlled,  Endo/Other    Renal/GU      Musculoskeletal  (+) Arthritis ,   Abdominal   Peds  Hematology   Anesthesia Other Findings   Reproductive/Obstetrics                             Anesthesia Physical Anesthesia Plan  ASA: II  Anesthesia Plan: General   Post-op Pain Management: GA combined w/ Regional for post-op pain   Induction: Intravenous  PONV Risk Score and Plan:   Airway Management Planned:   Additional Equipment:   Intra-op Plan:   Post-operative Plan: Extubation in OR  Informed Consent: I have reviewed the patients History and Physical, chart, labs and discussed the procedure including the risks, benefits and alternatives for the proposed anesthesia with the patient or authorized representative who has indicated his/her understanding and acceptance.   Dental advisory given  Plan Discussed with: CRNA  Anesthesia Plan Comments:         Anesthesia Quick Evaluation

## 2018-08-12 NOTE — H&P (Signed)
  H and P reviewed. No changes. Uploaded at later date. 

## 2018-08-15 ENCOUNTER — Encounter: Payer: Self-pay | Admitting: Unknown Physician Specialty

## 2020-03-25 ENCOUNTER — Other Ambulatory Visit: Payer: Self-pay | Admitting: Family Medicine

## 2020-03-25 DIAGNOSIS — M8588 Other specified disorders of bone density and structure, other site: Secondary | ICD-10-CM

## 2020-03-25 DIAGNOSIS — M8580 Other specified disorders of bone density and structure, unspecified site: Secondary | ICD-10-CM

## 2020-03-25 DIAGNOSIS — Z1231 Encounter for screening mammogram for malignant neoplasm of breast: Secondary | ICD-10-CM

## 2020-03-30 ENCOUNTER — Encounter: Payer: Self-pay | Admitting: Emergency Medicine

## 2020-03-30 ENCOUNTER — Ambulatory Visit
Admission: EM | Admit: 2020-03-30 | Discharge: 2020-03-30 | Disposition: A | Discharge status: Home / Self Care | Payer: Medicare Other | Attending: Emergency Medicine | Admitting: Emergency Medicine

## 2020-03-30 ENCOUNTER — Other Ambulatory Visit: Payer: Self-pay

## 2020-03-30 DIAGNOSIS — N3001 Acute cystitis with hematuria: Secondary | ICD-10-CM

## 2020-03-30 LAB — URINALYSIS, COMPLETE (UACMP) WITH MICROSCOPIC
Bilirubin Urine: NEGATIVE
Glucose, UA: NEGATIVE mg/dL
Nitrite: NEGATIVE
Protein, ur: NEGATIVE mg/dL
Specific Gravity, Urine: 1.025 (ref 1.005–1.030)
WBC, UA: >50 WBC/hpf (ref 0–5)
pH: 6 (ref 5.0–8.0)

## 2020-03-30 MED ORDER — AMOXICILLIN-POT CLAVULANATE 500-125 MG PO TABS: 1.0000 | ORAL_TABLET | Freq: Two times a day (BID) | ORAL | 0 refills | Status: AC

## 2020-03-30 NOTE — Discharge Instructions (Addendum)
Your urine is consistent with a urinary tract infection.  I am placing you on Augmentin since your last 2 urine cultures were positive for a germ that was sensitive to Augmentin.  Make sure you drink plenty of extra fluids.  May take Azo as needed for symptom relief.  We will call you with your urine culture results only if we need to make any changes.

## 2020-03-30 NOTE — ED Triage Notes (Signed)
Patient c/o burning when urinating and urinary frequency that started on Monday.  Patient states that she started an old prescription of Bactrim on Wed.  Patient states that her symptoms have not improved but gotten worse.  Patient denies fevers.

## 2020-03-30 NOTE — ED Provider Notes (Signed)
HPI  SUBJECTIVE:  Desiree Keith is a 86 y.o. female who presents with a "UTI" starting 5 to 6 days ago.  She reports dysuria, urgency, frequency.  No nausea vomiting fevers abdominal back pelvic pain.  No cloudy odorous urine hematuria.  No vaginal itching bleeding odor discharge.  She is not sexually active.  States that she tried calling urology yesterday and the first available appointment was in July.  No antipyretic in the past 4 to 6 hours.  She has been taking leftover prescription of Bactrim once a day for the past 4 days has been getting worse despite this.  No alleviating factors.  Symptoms are worse with urination.  She has a past medical history of hypertension, frequent UTI and remote history of vaginal yeast infections.  No history of diabetes chronic kidney disease nephrolithiasis BV.  Recently treated at [facility] urgent care for a UTI on 02/19/2020.  Sent home with Azo OTC.  Urine culture came back positive for enterococcus faecalis and Streptococcus that was sensitive to ampicillin.  She was placed on Augmentin which patient states worked well.  Follow-up visit with PCP on 3/1, declined referral to urology.  Has been seen here twice in 2018, first time had a Klebsiella pneumonia UTI second time had Enterococcus faecalis UTI that was sensitive to ampicillin.  [id number], Britta Mccreedy, MD Urology: PA [facility] at [facility]rology [facility]   Past Medical History:  Diagnosis Date  . Arthritis    OSTEOARTHRITIS  . Asthma   . Edema   . GERD (gastroesophageal reflux disease)   . Hearing aid worn    BILATERAL  . Hypertension   . Migraines   . Motion sickness     Past Surgical History:  Procedure Laterality Date  . ABDOMINAL HYSTERECTOMY    . APPENDECTOMY    . CARPAL TUNNEL RELEASE Right 08/12/2018   Procedure: OPEN CARPAL TUNNEL RELEASE;  Surgeon: Erin Sons, MD;  Location: [facility];  Service: Orthopedics;  Laterality: Right;  . CHOLECYSTECTOMY    . GALLBLADDER SURGERY     . TONSILLECTOMY      Family History  Problem Relation Age of Onset  . Heart failure Father   . Breast cancer Maternal Aunt     Social History   Tobacco Use  . Smoking status: Never Smoker  . Smokeless tobacco: Never Used  Substance Use Topics  . Alcohol use: No  . Drug use: No    No current facility-administered medications for this encounter.  Current Outpatient Medications:  .  ACIDOPHILUS LACTOBACILLUS PO, Take by mouth., Disp: , Rfl:  .  albuterol (PROVENTIL HFA;VENTOLIN HFA) 108 (90 Base) MCG/ACT inhaler, Inhale 2 puffs into the lungs every 4 (four) hours as needed for wheezing or shortness of breath., Disp: , Rfl:  .  cetirizine (ZYRTEC) 10 MG tablet, Take 10 mg by mouth daily., Disp: , Rfl:  .  Cholecalciferol (VITAMIN D3) 2000 units TABS, Take by mouth 2 (two) times daily., Disp: , Rfl:  .  Fluticasone-Salmeterol (ADVAIR DISKUS) 100-50 MCG/DOSE AEPB, Inhale 1 puff into the lungs 2 (two) times daily., Disp: 60 each, Rfl: 0 .  HM OMEGA-3-6-9 FATTY ACIDS CAPS, Take 1,000 mg by mouth daily., Disp: , Rfl:  .  hydrochlorothiazide (HYDRODIURIL) 25 MG tablet, Take 25 mg by mouth daily., Disp: , Rfl:  .  Magnesium Oxide 200 MG TABS, Take 235 mg by mouth., Disp: , Rfl:  .  methenamine (HIPREX) 1 G tablet, Take 1 g by mouth 2 (two) times  daily with a meal., Disp: , Rfl:  .  potassium chloride SA (K-DUR,KLOR-CON) 20 MEQ tablet, Take 99 mEq by mouth once. , Disp: , Rfl:  .  Turmeric 400 MG CAPS, Take 1,000 mg by mouth., Disp: , Rfl:  .  vitamin C (ASCORBIC ACID) 500 MG tablet, Take 500 mg by mouth daily., Disp: , Rfl:  .  amoxicillin-clavulanate (AUGMENTIN) 500-125 MG tablet, Take 1 tablet (500 mg total) by mouth in the morning and at bedtime for 7 days., Disp: 14 tablet, Rfl: 0 .  azelastine (ASTELIN) 0.1 % nasal spray, Place 1 spray into both nostrils 2 (two) times daily. Use in each nostril as directed, Disp: , Rfl:  .  ipratropium-albuterol (DUONEB) 0.5-2.5 (3) MG/3ML SOLN, Take  3 mLs by nebulization every 6 (six) hours as needed., Disp: 360 mL, Rfl: 0 .  naproxen (NAPROSYN) 500 MG tablet, Take 500 mg by mouth as needed., Disp: , Rfl:  .  ranitidine (ZANTAC) 150 MG capsule, Take 150 mg by mouth every evening., Disp: , Rfl:  .  sodium chloride (OCEAN) 0.65 % SOLN nasal spray, Place 2 sprays into both nostrils every 2 (two) hours while awake., Disp: , Rfl: 0  Allergies  Allergen Reactions  . Macrobid [Nitrofurantoin Monohyd Macro] Anaphylaxis  . Demerol [Meperidine] Nausea And Vomiting  . Levaquin [Levofloxacin] Other (See Comments)    Body aches and cramps  . Microbial Antigen   . Moxifloxacin Hives     ROS  As noted in HPI.   Physical Exam  BP (!) 157/77 (BP Location: Left Arm)   Pulse 90   Temp 97.6 F (36.4 C) (Oral)   Resp 14   Ht 5\' 2"  (1.575 m)   Wt 68 kg   SpO2 100%   BMI 27.44 kg/m   Constitutional: Well developed, well nourished, no acute distress Eyes:  EOMI, conjunctiva normal bilaterally HENT: Normocephalic, atraumatic,mucus membranes moist Respiratory: Normal inspiratory effort Cardiovascular: Normal rate GI: nondistended positive suprapubic tenderness.  No flank tenderness. Back: No CVAT skin: No rash, skin intact Musculoskeletal: no deformities Neurologic: Alert & oriented x 3, no focal neuro deficits Psychiatric: Speech and behavior appropriate   ED Course   Medications - No data to display  Orders Placed This Encounter  Procedures  . Urine culture    Standing Status:   Standing    Number of Occurrences:   1    Order Specific Question:   List patient's active antibiotics    Answer:   likely augmentin  . Urinalysis, Complete w Microscopic    Standing Status:   Standing    Number of Occurrences:   1    Results for orders placed or performed during the hospital encounter of 03/30/20 (from the past 24 hour(s))  Urinalysis, Complete w Microscopic     Status: Abnormal   Collection Time: 03/30/20  3:19 PM  Result  Value Ref Range   Color, Urine YELLOW YELLOW   APPearance CLOUDY (A) CLEAR   Specific Gravity, Urine 1.025 1.005 - 1.030   pH 6.0 5.0 - 8.0   Glucose, UA NEGATIVE NEGATIVE mg/dL   Hgb urine dipstick TRACE (A) NEGATIVE   Bilirubin Urine NEGATIVE NEGATIVE   Ketones, ur TRACE (A) NEGATIVE mg/dL   Protein, ur NEGATIVE NEGATIVE mg/dL   Nitrite NEGATIVE NEGATIVE   Leukocytes,Ua MODERATE (A) NEGATIVE   Squamous Epithelial / LPF 0-5 0 - 5   WBC, UA >50 0 - 5 WBC/hpf   RBC / HPF 6-10 0 -  5 RBC/hpf   Bacteria, UA MANY (A) NONE SEEN   No results found.  ED Clinical Impression  1. Acute cystitis with hematuria      ED Assessment/Plan  Outside records, labs reviewed.  As noted in HPI.  UA consistent with UTI.  Will send home with Augmentin because her past 2 urine cultures have been positive for Enterococcus faecalis which is sensitive to ampicillin. Reports body aches and cramps with Levaquin, anaphylaxis with Macrobid.  Initially requested to wait for urine culture results prior to initiating antibiotics, but given that I do not know how long the urine culture will take, would like to start her on antibiotics today and we can change them if necessary.  Augmentin 500 mg twice daily for 7 days per up-to-date guidelines.  Azo OTC as needed.  Push fluids.  Follow-up with PMD as needed.  To the ER if she gets worse.  Discussed labs, MDM, treatment plan, and plan for follow-up with patient. Discussed sn/sx that should prompt return to the ED. patient agrees with plan.   Meds ordered this encounter  Medications  . amoxicillin-clavulanate (AUGMENTIN) 500-125 MG tablet    Sig: Take 1 tablet (500 mg total) by mouth in the morning and at bedtime for 7 days.    Dispense:  14 tablet    Refill:  0    *This clinic note was created using Scientist, clinical (histocompatibility and immunogenetics). Therefore, there may be occasional mistakes despite careful proofreading.   ?    Domenick Gong, MD 03/30/20 [id number]

## 2020-04-01 LAB — URINE CULTURE: Culture: >=100000 — AB

## 2020-04-05 ENCOUNTER — Telehealth: Payer: Self-pay | Admitting: Urgent Care

## 2020-04-05 MED ORDER — CIPROFLOXACIN HCL 500 MG PO TABS: 500.0000 mg | ORAL_TABLET | Freq: Two times a day (BID) | ORAL | 0 refills | Status: DC

## 2020-04-05 NOTE — Telephone Encounter (Signed)
Date: 04/05/2020  Name: Desiree Keith DOB: [birth year] MRN: [id number]  Re: Worsening in symptoms - change in therapy  Patient presents to clinic with continued UTI symptoms. She feels as if she is worsening following her visit to [facility] on 03/30/2020. Notes reviewed. Patient was seen by Dr. Chaney Malling and diagnosed with acute cystitis. She was treated with a course of amoxicillin-clavulanate. Briefly spoke with patient and she advises that her symptoms continue to be significant. She is now experiencing nausea without vomiting. Patient denies fevers. She is requesting a change in therapy, however does not wish to check in for another visit. Given recent visit and unrelieved symptoms, this is reasonable. Labs from 03/30/2020 visit reviewed as follows:  Recent Results (from the past 2160 hour(s))  Urinalysis, Complete w Microscopic     Status: Abnormal   Collection Time: 03/30/20  3:19 PM  Result Value Ref Range   Color, Urine YELLOW YELLOW   APPearance CLOUDY (A) CLEAR   Specific Gravity, Urine 1.025 1.005 - 1.030   pH 6.0 5.0 - 8.0   Glucose, UA NEGATIVE NEGATIVE mg/dL   Hgb urine dipstick TRACE (A) NEGATIVE   Bilirubin Urine NEGATIVE NEGATIVE   Ketones, ur TRACE (A) NEGATIVE mg/dL   Protein, ur NEGATIVE NEGATIVE mg/dL   Nitrite NEGATIVE NEGATIVE   Leukocytes,Ua MODERATE (A) NEGATIVE   Squamous Epithelial / LPF 0-5 0 - 5   WBC, UA >50 0 - 5 WBC/hpf   RBC / HPF 6-10 0 - 5 RBC/hpf   Bacteria, UA MANY (A) NONE SEEN    Comment: Performed at [facility], [address] [place], [place] [address]  Urine culture     Status: Abnormal   Collection Time: 03/30/20  3:19 PM   Specimen: Urine, Clean Catch  Result Value Ref Range   Specimen Description      URINE, CLEAN CATCH Performed at [facility], [address], [place], [place] [address]    Special Requests      likely augmentin Performed at [facility], [address], [place], [place]  [address]    Culture >=100,000 COLONIES/mL [facility] MORGANII (A)    Report Status 04/01/2020 FINAL    Organism ID, Bacteria MORGANELLA MORGANII (A)       Susceptibility   Morganella morganii - MIC*    AMPICILLIN >=32 RESISTANT Resistant     CEFAZOLIN >=64 RESISTANT Resistant     CIPROFLOXACIN 1 SENSITIVE Sensitive     GENTAMICIN <=1 SENSITIVE Sensitive     IMIPENEM 2 SENSITIVE Sensitive     NITROFURANTOIN 128 RESISTANT Resistant     TRIMETH/SULFA >=320 RESISTANT Resistant     AMPICILLIN/SULBACTAM >=32 RESISTANT Resistant     PIP/TAZO <=4 SENSITIVE Sensitive     * >=100,000 COLONIES/mL MORGANELLA MORGANII   Disposition: C&S reviewed and there is demonstrated resistance to the antibiotic that she was prescribed (amoxicillin-clavulanate). Patient has multiple allergies. (+) sensitivity to flouroquinolones, however patient notes an intolerance to levofloxacin. Discussed treatment options. Patient has successfully taken ciprofloxacin in the past and tolerated it well. Patient reports that she has a supply on ondansetron at home for PRN use. She was advised to continue to increase her fluid intake as much as possible, with water being the best option in order to flush her urinary tract. Will send prescription to patient's pharmacy on record as follows:  Meds ordered this encounter  Medications  . ciprofloxacin (CIPRO) 500 MG tablet    Sig: Take 1  tablet (500 mg total) by mouth every 12 (twelve) hours.    Dispense:  10 tablet    Refill:  0   Honor Loh, MSN, APRN, FNP-C, CEN Advanced Practice Provider [facility] 04/05/2020 8:17 AM

## 2020-05-29 ENCOUNTER — Ambulatory Visit
Admission: RE | Admit: 2020-05-29 | Discharge: 2020-05-29 | Disposition: A | Discharge status: Home / Self Care | Payer: Medicare Other | Source: Ambulatory Visit | Attending: Family Medicine | Admitting: Family Medicine

## 2020-05-29 ENCOUNTER — Other Ambulatory Visit: Payer: Self-pay

## 2020-05-29 DIAGNOSIS — M8580 Other specified disorders of bone density and structure, unspecified site: Secondary | ICD-10-CM

## 2020-05-29 DIAGNOSIS — M8588 Other specified disorders of bone density and structure, other site: Secondary | ICD-10-CM | POA: Diagnosis present

## 2021-05-10 ENCOUNTER — Encounter: Payer: Self-pay | Admitting: Gynecology

## 2021-05-10 ENCOUNTER — Other Ambulatory Visit: Payer: Self-pay

## 2021-05-10 ENCOUNTER — Ambulatory Visit
Admission: EM | Admit: 2021-05-10 | Discharge: 2021-05-10 | Disposition: A | Discharge status: Home / Self Care | Payer: Medicare Other | Attending: Emergency Medicine | Admitting: Emergency Medicine

## 2021-05-10 DIAGNOSIS — N390 Urinary tract infection, site not specified: Secondary | ICD-10-CM | POA: Diagnosis not present

## 2021-05-10 LAB — URINALYSIS, COMPLETE (UACMP) WITH MICROSCOPIC
Bilirubin Urine: NEGATIVE
Glucose, UA: NEGATIVE mg/dL
Ketones, ur: NEGATIVE mg/dL
Nitrite: NEGATIVE
Protein, ur: NEGATIVE mg/dL
Specific Gravity, Urine: 1.015 (ref 1.005–1.030)
WBC, UA: >50 WBC/hpf (ref 0–5)
pH: 6.5 (ref 5.0–8.0)

## 2021-05-10 MED ORDER — CEFPODOXIME PROXETIL 200 MG PO TABS: 200.0000 mg | ORAL_TABLET | Freq: Two times a day (BID) | ORAL | 0 refills | Status: AC

## 2021-05-10 NOTE — Discharge Instructions (Signed)
I am concerned about antibiotic resistance with your urinary tract infection. I have sent your urine to be cultured. I have given you an antibiotic, but given your recent history this may not truly treat your infection.  If any worsening of symptoms- fever, weakness, back or abdominal pain, blood in urine, nausea or otherwise worsening please go to the ER as you likely will need IV antibiotics again.

## 2021-05-10 NOTE — ED Triage Notes (Signed)
Pt c/o x yesterday burning sensation and pain with urination.

## 2021-05-10 NOTE — ED Provider Notes (Signed)
[facility]    CSN: [id number] Arrival date & time: 05/10/21  0809      History   Chief Complaint No chief complaint on file.   HPI Desiree Keith is a 87 y.o. female.   Desiree Keith presents with complaints of burning and frequency of urination which started yesterday. She had just recently completed a course of IV antibiotics via PICC line, admitted to the hospital due to resistant UTI on 4/28. She had improved and her urine culture at follow up on 5/6 was negative. Symptoms returned yesterday. She has follow up with urology in a few weeks. No fevers. No abdominal or back pain. No nausea or vomiting. History of UTI's, typically approximately 2 per year, so has had more frequently currently.    ROS per HPI, negative if not otherwise mentioned.      Past Medical History:  Diagnosis Date  . Arthritis    OSTEOARTHRITIS  . Asthma   . Edema   . GERD (gastroesophageal reflux disease)   . Hearing aid worn    BILATERAL  . Hypertension   . Migraines   . Motion sickness     Patient Active Problem List   Diagnosis Date Noted  . Dysuria 12/04/2017  . Urinary tract infection with hematuria 12/04/2017  . Elevated blood pressure reading 12/04/2017    Past Surgical History:  Procedure Laterality Date  . ABDOMINAL HYSTERECTOMY    . APPENDECTOMY    . CARPAL TUNNEL RELEASE Right 08/12/2018   Procedure: OPEN CARPAL TUNNEL RELEASE;  Surgeon: Erin Sons, MD;  Location: [facility];  Service: Orthopedics;  Laterality: Right;  . CHOLECYSTECTOMY    . GALLBLADDER SURGERY    . TONSILLECTOMY      OB History   No obstetric history on file.      Home Medications    Prior to Admission medications   Medication Sig Start Date End Date Taking? Authorizing Provider  ACIDOPHILUS LACTOBACILLUS PO Take by mouth.   Yes [provider]  albuterol (PROVENTIL HFA;VENTOLIN HFA) 108 (90 Base) MCG/ACT inhaler Inhale 2 puffs into the lungs every 4 (four)  hours as needed for wheezing or shortness of breath.   Yes [provider]  azelastine (ASTELIN) 0.1 % nasal spray Place 1 spray into both nostrils 2 (two) times daily. Use in each nostril as directed   Yes [provider]  cefpodoxime (VANTIN) 200 MG tablet Take 1 tablet (200 mg total) by mouth 2 (two) times daily for 10 days. 05/10/21 05/20/21 Yes Erza Mothershead, Dorene Grebe B, NP  cetirizine (ZYRTEC) 10 MG tablet Take 10 mg by mouth daily.   Yes [provider]  Cholecalciferol (VITAMIN D3) 2000 units TABS Take by mouth 2 (two) times daily.   Yes [provider]  Fluticasone-Salmeterol (ADVAIR DISKUS) 100-50 MCG/DOSE AEPB Inhale 1 puff into the lungs 2 (two) times daily. 05/08/16  Yes Hassan Rowan, MD  HM OMEGA-3-6-9 FATTY ACIDS CAPS Take 1,000 mg by mouth daily.   Yes [provider]  hydrochlorothiazide (HYDRODIURIL) 25 MG tablet Take 25 mg by mouth daily.   Yes [provider]  Magnesium Oxide 200 MG TABS Take 235 mg by mouth.   Yes [provider]  methenamine (HIPREX) 1 G tablet Take 1 g by mouth 2 (two) times daily with a meal.   Yes [provider]  naproxen (NAPROSYN) 500 MG tablet Take 500 mg by mouth as needed.   Yes [provider]  potassium chloride SA (  K-DUR,KLOR-CON) 20 MEQ tablet Take 99 mEq by mouth once.    Yes [provider]  ranitidine (ZANTAC) 150 MG capsule Take 150 mg by mouth every evening.   Yes [provider]  sodium chloride (OCEAN) 0.65 % SOLN nasal spray Place 2 sprays into both nostrils every 2 (two) hours while awake. 11/19/15  Yes Betancourt, Jarold Song, NP  Turmeric 400 MG CAPS Take 1,000 mg by mouth.   Yes [provider]  vitamin C (ASCORBIC ACID) 500 MG tablet Take 500 mg by mouth daily.   Yes [provider]  ciprofloxacin (CIPRO) 500 MG tablet Take 1 tablet (500 mg total) by mouth every 12 (twelve) hours. 04/05/20   Verlee Monte, NP  ipratropium-albuterol  (DUONEB) 0.5-2.5 (3) MG/3ML SOLN Take 3 mLs by nebulization every 6 (six) hours as needed. 11/19/15 11/23/15  BetancourtJarold Song, NP    Family History Family History  Problem Relation Age of Onset  . Heart failure Father   . Breast cancer Maternal Aunt     Social History Social History   Tobacco Use  . Smoking status: Never Smoker  . Smokeless tobacco: Never Used  Vaping Use  . Vaping Use: Never used  Substance Use Topics  . Alcohol use: No  . Drug use: No     Allergies   Macrobid [nitrofurantoin monohyd macro], Macrobid [nitrofurantoin], Demerol [meperidine], Levaquin [levofloxacin], Microbial antigen, and Moxifloxacin   Review of Systems Review of Systems   Physical Exam Triage Vital Signs ED Triage Vitals  Enc Vitals Group     BP 05/10/21 0824 (!) 152/71     Pulse Rate 05/10/21 0824 68     Resp 05/10/21 0824 16     Temp 05/10/21 0824 98.4 F (36.9 C)     Temp Source 05/10/21 0824 Oral     SpO2 05/10/21 0824 99 %     Weight 05/10/21 0826 152 lb (68.9 kg)     Height 05/10/21 0826 5\' 2"  (1.575 m)     Head Circumference --      Peak Flow --      Pain Score 05/10/21 0824 5     Pain Loc --      Pain Edu? --      Excl. in GC? --    No data found.  Updated Vital Signs BP (!) 152/71 (BP Location: Right Arm)   Pulse 68   Temp 98.4 F (36.9 C) (Oral)   Resp 16   Ht 5\' 2"  (1.575 m)   Wt 152 lb (68.9 kg)   SpO2 99%   BMI 27.80 kg/m   Visual Acuity Right Eye Distance:   Left Eye Distance:   Bilateral Distance:    Right Eye Near:   Left Eye Near:    Bilateral Near:     Physical Exam Constitutional:      General: She is not in acute distress.    Appearance: She is well-developed.  Cardiovascular:     Rate and Rhythm: Normal rate.  Pulmonary:     Effort: Pulmonary effort is normal.  Abdominal:     Tenderness: There is no abdominal tenderness. There is no right CVA tenderness or left CVA tenderness.  Skin:    General: Skin is warm and dry.   Neurological:     Mental Status: She is alert and oriented to person, place, and time.      UC Treatments / Results  Labs (all labs ordered are listed, but only abnormal results  are displayed) Labs Reviewed  URINALYSIS, COMPLETE (UACMP) WITH MICROSCOPIC - Abnormal; Notable for the following components:      Result Value   APPearance HAZY (*)    Hgb urine dipstick TRACE (*)    Leukocytes,Ua LARGE (*)    Non Squamous Epithelial PRESENT (*)    Bacteria, UA MANY (*)    All other components within normal limits  URINE CULTURE    EKG   Radiology No results found.  Procedures Procedures (including critical care time)  Medications Ordered in UC Medications - No data to display  Initial Impression / Assessment and Plan / UC Course  I have reviewed the triage vital signs and the nursing notes.  Pertinent labs & imaging results that were available during my care of the patient were reviewed by me and considered in my medical decision making (see chart for details).     Non toxic currently, active, ambulatory, alert with stable vitals. Urine does appear consistent with UTI, again, unfortunately. Culture is pending. Culture reviewed from previous admission just a few weeks ago, and confirmed that it was resistant to oral antibiotics. Patient would like to wait to see culture before starting any antibiotics, which does seem fairly reasonable given her history, but also given that she is an 87 year old female who has become septic in the past this is also uncomfortable. vantin provided today as a hopeful option, although given her recent resistance it is concerning that she may likely need iv antibiotics again. Discussed low threshold to go to the ER as we await culture results to further direct treatment. Patient verbalized understanding and agreeable to plan.  Ambulatory out of clinic without difficulty.   Final Clinical Impressions(s) / UC Diagnoses   Final diagnoses:  Recurrent  urinary tract infection     Discharge Instructions     I am concerned about antibiotic resistance with your urinary tract infection. I have sent your urine to be cultured. I have given you an antibiotic, but given your recent history this may not truly treat your infection.  If any worsening of symptoms- fever, weakness, back or abdominal pain, blood in urine, nausea or otherwise worsening please go to the ER as you likely will need IV antibiotics again.    ED Prescriptions    Medication Sig Dispense Auth. Provider   cefpodoxime (VANTIN) 200 MG tablet Take 1 tablet (200 mg total) by mouth 2 (two) times daily for 10 days. 20 tablet Georgetta Haber, NP     PDMP not reviewed this encounter.   Georgetta Haber, NP 05/10/21 [phone]

## 2021-05-13 ENCOUNTER — Telehealth: Payer: Self-pay | Admitting: Emergency Medicine

## 2021-05-13 LAB — URINE CULTURE: Culture: >=100000 — AB

## 2021-05-13 MED ORDER — CEPHALEXIN 500 MG PO CAPS: 500.0000 mg | ORAL_CAPSULE | Freq: Two times a day (BID) | ORAL | 0 refills | Status: DC

## 2021-05-13 MED ORDER — PHENAZOPYRIDINE HCL 200 MG PO TABS: 200.0000 mg | ORAL_TABLET | Freq: Three times a day (TID) | ORAL | 0 refills | Status: DC

## 2021-10-10 ENCOUNTER — Encounter: Payer: Self-pay | Admitting: Emergency Medicine

## 2021-10-10 ENCOUNTER — Ambulatory Visit
Admission: EM | Admit: 2021-10-10 | Discharge: 2021-10-10 | Disposition: A | Discharge status: Home / Self Care | Payer: Medicare Other | Attending: Physician Assistant | Admitting: Physician Assistant

## 2021-10-10 ENCOUNTER — Other Ambulatory Visit: Payer: Self-pay

## 2021-10-10 DIAGNOSIS — R300 Dysuria: Secondary | ICD-10-CM | POA: Diagnosis present

## 2021-10-10 DIAGNOSIS — N3001 Acute cystitis with hematuria: Secondary | ICD-10-CM | POA: Insufficient documentation

## 2021-10-10 LAB — URINALYSIS, COMPLETE (UACMP) WITH MICROSCOPIC
Bilirubin Urine: NEGATIVE
Glucose, UA: NEGATIVE mg/dL
Ketones, ur: NEGATIVE mg/dL
Nitrite: NEGATIVE
Protein, ur: NEGATIVE mg/dL
Specific Gravity, Urine: 1.015 (ref 1.005–1.030)
WBC, UA: >50 WBC/hpf (ref 0–5)
pH: 8.5 — ABNORMAL HIGH (ref 5.0–8.0)

## 2021-10-10 MED ORDER — CEPHALEXIN 500 MG PO CAPS: 500.0000 mg | ORAL_CAPSULE | Freq: Two times a day (BID) | ORAL | 0 refills | Status: AC

## 2021-10-10 NOTE — Discharge Instructions (Signed)

## 2021-10-10 NOTE — ED Triage Notes (Signed)
Frequency and pain started yesterday. Was treated for a UTI with bactrim aprox one month ago.

## 2021-10-10 NOTE — ED Provider Notes (Signed)
[facility]    CSN: [id number] Arrival date & time: 10/10/21  8850      History   Chief Complaint Chief Complaint  Patient presents with   Recurrent UTI    HPI Desiree Keith is a 87 y.o. female presenting for onset of dysuria, urinary frequency/urgency yesterday.  Admits to temperatures up to 100 degrees yesterday.  Denies abdominal/pelvic pain or back/flank pain.  No hematuria reported.  Patient has history of recurrent UTIs.  Patient last treated for UTI 3 weeks ago with Bactrim DS.  Patient has already been treated for UTI multiple times this month. Patient appears to have had a UTI every month since March of this year.  Patient is followed by [facility] u[facility].  Her last appointment was in August 2022.  Patient takes d-mannose and cranberry tablets as well as uses estrogen creams to try to prevent infection.  Patient says she has started these treatments over the past 4 months.  No other complaints today.  HPI  Past Medical History:  Diagnosis Date   Arthritis    OSTEOARTHRITIS   Asthma    Edema    GERD (gastroesophageal reflux disease)    Hearing aid worn    BILATERAL   Hypertension    Migraines    Motion sickness     Patient Active Problem List   Diagnosis Date Noted   Dysuria 12/04/2017   Urinary tract infection with hematuria 12/04/2017   Elevated blood pressure reading 12/04/2017    Past Surgical History:  Procedure Laterality Date   ABDOMINAL HYSTERECTOMY     APPENDECTOMY     CARPAL TUNNEL RELEASE Right 08/12/2018   Procedure: OPEN CARPAL TUNNEL RELEASE;  Surgeon: Erin Sons, MD;  Location: [facility];  Service: Orthopedics;  Laterality: Right;   CHOLECYSTECTOMY     GALLBLADDER SURGERY     TONSILLECTOMY      OB History   No obstetric history on file.      Home Medications    Prior to Admission medications   Medication Sig Start Date End Date Taking? Authorizing Provider  cephALEXin (KEFLEX) 500 MG capsule Take 1  capsule (500 mg total) by mouth 2 (two) times daily for 7 days. 10/10/21 10/17/21 Yes Eusebio Friendly B, PA-C  ACIDOPHILUS LACTOBACILLUS PO Take by mouth.    [provider]  albuterol (PROVENTIL HFA;VENTOLIN HFA) 108 (90 Base) MCG/ACT inhaler Inhale 2 puffs into the lungs every 4 (four) hours as needed for wheezing or shortness of breath.    [provider]  azelastine (ASTELIN) 0.1 % nasal spray Place 1 spray into both nostrils 2 (two) times daily. Use in each nostril as directed    [provider]  cetirizine (ZYRTEC) 10 MG tablet Take 10 mg by mouth daily.    [provider]  Cholecalciferol (VITAMIN D3) 2000 units TABS Take by mouth 2 (two) times daily.    [provider]  Fluticasone-Salmeterol (ADVAIR DISKUS) 100-50 MCG/DOSE AEPB Inhale 1 puff into the lungs 2 (two) times daily. 05/08/16   Hassan Rowan, MD  HM OMEGA-3-6-9 FATTY ACIDS CAPS Take 1,000 mg by mouth daily.    [provider]  hydrochlorothiazide (HYDRODIURIL) 25 MG tablet Take 25 mg by mouth daily.    [provider]  ipratropium-albuterol (DUONEB) 0.5-2.5 (3) MG/3ML SOLN Take 3 mLs by nebulization every 6 (six) hours as needed. 11/19/15 11/23/15  Betancourt, Jarold Song, NP  Magnesium Oxide 200 MG TABS Take 235 mg by mouth.  [provider]  methenamine (HIPREX) 1 G tablet Take 1 g by mouth 2 (two) times daily with a meal.    [provider]  naproxen (NAPROSYN) 500 MG tablet Take 500 mg by mouth as needed.    [provider]  potassium chloride SA (K-DUR,KLOR-CON) 20 MEQ tablet Take 99 mEq by mouth once.     [provider]  ranitidine (ZANTAC) 150 MG capsule Take 150 mg by mouth every evening.    [provider]  sodium chloride (OCEAN) 0.65 % SOLN nasal spray Place 2 sprays into both nostrils every 2 (two) hours while awake. 11/19/15   Betancourt, Jarold Song, NP  Turmeric 400 MG CAPS Take 1,000 mg by mouth.    [provider]  vitamin C (ASCORBIC ACID) 500 MG tablet Take 500 mg by mouth daily.    [provider]    Family History Family History  Problem Relation Age of Onset   Heart failure Father    Breast cancer Maternal Aunt     Social History Social History   Tobacco Use   Smoking status: Never   Smokeless tobacco: Never  Vaping Use   Vaping Use: Never used  Substance Use Topics   Alcohol use: No   Drug use: No     Allergies   Macrobid [nitrofurantoin monohyd macro], Macrobid [nitrofurantoin], Demerol [meperidine], Levaquin [levofloxacin], Microbial antigen, and Moxifloxacin   Review of Systems Review of Systems  Constitutional:  Negative for chills, fatigue and fever.  Gastrointestinal:  Negative for abdominal pain, diarrhea, nausea and vomiting.  Genitourinary:  Positive for dysuria, frequency and urgency. Negative for decreased urine volume, flank pain, hematuria, pelvic pain, vaginal bleeding, vaginal discharge and vaginal pain.  Musculoskeletal:  Negative for back pain.  Skin:  Negative for rash.    Physical Exam Triage Vital Signs ED Triage Vitals  Enc Vitals Group     BP 10/10/21 0825 (!) 142/82     Pulse Rate 10/10/21 0825 (!) 59     Resp 10/10/21 0825 16     Temp 10/10/21 0825 98.3 F (36.8 C)     Temp Source 10/10/21 0825 Oral     SpO2 10/10/21 0825 100 %     Weight --      Height --      Head Circumference --      Peak Flow --      Pain Score 10/10/21 0824 4     Pain Loc --      Pain Edu? --      Excl. in GC? --    No data found.  Updated Vital Signs BP (!) 142/82   Pulse (!) 59   Temp 98.3 F (36.8 C) (Oral)   Resp 16   SpO2 100%      Physical Exam Vitals and nursing note reviewed.  Constitutional:      General: She is not in acute distress.    Appearance: Normal appearance. She is not ill-appearing or toxic-appearing.  HENT:     Head: Normocephalic and atraumatic.  Eyes:     General: No scleral icterus.       Right eye: No  discharge.        Left eye: No discharge.     Conjunctiva/sclera: Conjunctivae normal.  Cardiovascular:     Rate and Rhythm: Regular rhythm. Bradycardia present.     Heart sounds: Normal heart sounds.  Pulmonary:     Effort: Pulmonary effort is normal. No respiratory distress.  Breath sounds: Normal breath sounds.  Abdominal:     Palpations: Abdomen is soft.     Tenderness: There is no abdominal tenderness. There is no right CVA tenderness or left CVA tenderness.  Musculoskeletal:     Cervical back: Neck supple.  Skin:    General: Skin is dry.  Neurological:     General: No focal deficit present.     Mental Status: She is alert. Mental status is at baseline.     Motor: No weakness.     Gait: Gait normal.  Psychiatric:        Mood and Affect: Mood normal.        Behavior: Behavior normal.        Thought Content: Thought content normal.     UC Treatments / Results  Labs (all labs ordered are listed, but only abnormal results are displayed) Labs Reviewed  URINALYSIS, COMPLETE (UACMP) WITH MICROSCOPIC - Abnormal; Notable for the following components:      Result Value   APPearance HAZY (*)    pH 8.5 (*)    Hgb urine dipstick TRACE (*)    Leukocytes,Ua MODERATE (*)    Bacteria, UA MANY (*)    All other components within normal limits  URINE CULTURE    EKG   Radiology No results found.  Procedures Procedures (including critical care time)  Medications Ordered in UC Medications - No data to display  Initial Impression / Assessment and Plan / UC Course  I have reviewed the triage vital signs and the nursing notes.  Pertinent labs & imaging results that were available during my care of the patient were reviewed by me and considered in my medical decision making (see chart for details).  87 year old female presenting for dysuria, urinary frequency and urgency that started yesterday.  History of recurrent UTIs with the last UTI about 3 weeks ago.  Patient treated  with Bactrim DS at that time.  Urine did grow Klebsiella pneumoniae at that time and was pansensitive.  Patient is listed allergies include Macrobid, Levaquin and moxifloxacin.  Vitals stable today.  She is overall well-appearing.  No red flag signs or symptoms.  No tenderness palpation of abdomen and no CVA tenderness.  UA obtained today shows: Hazy appearance of urine, trace hemoglobin and moderate leukocytes. We will send urine for culture and treat for UTI at this time with Keflex.  Will amend treatment based on urine culture results.  Advised increasing rest and fluids.  Reviewed return ED guidelines.  Follow-up with [facility] uro[facility]ne[facility] as scheduled.   Final Clinical Impressions(s) / UC Diagnoses   Final diagnoses:  Acute cystitis with hematuria  Dysuria     Discharge Instructions      UTI: Based on either symptoms or urinalysis, you may have a urinary tract infection. We will send the urine for culture and call with results in a few days. Begin antibiotics at this time. Your symptoms should be much improved over the next 2-3 days. Increase rest and fluid intake. If for some reason symptoms are worsening or not improving after a couple of days or the urine culture determines the antibiotics you are taking will not treat the infection, the antibiotics may be changed. Return or go to ER for fever, back pain, worsening urinary pain, discharge, increased blood in urine. May take Tylenol or Motrin OTC for pain relief or consider AZO if no contraindications      ED Prescriptions     Medication Sig Dispense Auth. Provider  cephALEXin (KEFLEX) 500 MG capsule Take 1 capsule (500 mg total) by mouth 2 (two) times daily for 7 days. 14 capsule Shirlee Latch, PA-C      PDMP not reviewed this encounter.   Shirlee Latch, PA-C 10/10/21 [phone]

## 2021-10-12 LAB — URINE CULTURE: Culture: >=100000 — AB

## 2021-10-14 ENCOUNTER — Telehealth (HOSPITAL_COMMUNITY): Payer: Self-pay | Admitting: Emergency Medicine

## 2021-10-14 MED ORDER — FOSFOMYCIN TROMETHAMINE 3 G PO PACK
3.0000 g | PACK | Freq: Once | ORAL | 0 refills | Status: DC
Start: 2021-10-14 — End: 2021-10-14

## 2022-04-18 ENCOUNTER — Encounter: Payer: Self-pay | Admitting: Emergency Medicine

## 2022-04-18 ENCOUNTER — Other Ambulatory Visit: Payer: Self-pay

## 2022-04-18 ENCOUNTER — Ambulatory Visit
Admission: EM | Admit: 2022-04-18 | Discharge: 2022-04-18 | Disposition: A | Discharge status: Home / Self Care | Payer: Medicare Other | Attending: Physician Assistant | Admitting: Physician Assistant

## 2022-04-18 DIAGNOSIS — N3000 Acute cystitis without hematuria: Secondary | ICD-10-CM | POA: Diagnosis present

## 2022-04-18 LAB — URINALYSIS, ROUTINE W REFLEX MICROSCOPIC
Bilirubin Urine: NEGATIVE
Glucose, UA: NEGATIVE mg/dL
Ketones, ur: NEGATIVE mg/dL
Nitrite: POSITIVE — AB
Protein, ur: 100 mg/dL — AB
Specific Gravity, Urine: 1.02 (ref 1.005–1.030)
pH: 7 (ref 5.0–8.0)

## 2022-04-18 LAB — URINALYSIS, MICROSCOPIC (REFLEX): WBC, UA: >50 WBC/hpf (ref 0–5)

## 2022-04-18 MED ORDER — CEFPODOXIME PROXETIL 100 MG PO TABS
100.0000 mg | ORAL_TABLET | Freq: Two times a day (BID) | ORAL | 0 refills | Status: AC
Start: 2022-04-18 — End: 2022-04-25

## 2022-04-18 MED ORDER — CEFTRIAXONE SODIUM 1 G IJ SOLR
1.0000 g | Freq: Once | INTRAMUSCULAR | Status: AC
  Administered 2022-04-18: 1 g via INTRAMUSCULAR

## 2022-04-18 NOTE — ED Triage Notes (Signed)
Patient states that is currently on an antibiotic for UTI that was started on Monday.  Patient states that she still has the burning when urinating, bladder pressure, and frequency.  Patient states that since starting her antibiotic she has had some diarrhea.   ?

## 2022-04-18 NOTE — ED Provider Notes (Signed)
? ?[facility] ?Provider Note ? ?Patient Contact: 8:47 AM (approximate) ? ? ?History  ? ?Dysuria and Diarrhea ? ? ?HPI ? ?Desiree Keith is a 88 y.o. female who presents to the urgent care complaining of ongoing dysuria, polyuria as well as diarrhea.  Patient was diagnosed with a UTI 5 days ago.  She was started on Monurol and states that she took 2 doses of Monurol, One dose on Monday, 1 on Wednesday.  Patient states that initially she started feeling better, however is still experiencing dysuria, polyuria.  She started with diarrhea today.  No abdominal pain.  No fevers or chills.  No hematuria, no hematochezia.  Patient denies any flank pain.  Stone eating and drinking appropriately. ?  ? ? ?Physical Exam  ? ?Triage Vital Signs: ?ED Triage Vitals  ?Enc Vitals Group  ?   BP 04/18/22 0829 (!) 151/73  ?   Pulse Rate 04/18/22 0829 83  ?   Resp 04/18/22 0829 14  ?   Temp 04/18/22 0829 98.1 ?F (36.7 ?C)  ?   Temp Source 04/18/22 0829 Oral  ?   SpO2 04/18/22 0829 98 %  ?   Weight 04/18/22 0820 151 lb 14.4 oz (68.9 kg)  ?   Height --   ?   Head Circumference --   ?   Peak Flow --   ?   Pain Score 04/18/22 0819 5  ?   Pain Loc --   ?   Pain Edu? --   ?   Excl. in GC? --   ? ? ?Most recent vital signs: ?Vitals:  ? 04/18/22 0829  ?BP: (!) 151/73  ?Pulse: 83  ?Resp: 14  ?Temp: 98.1 ?F (36.7 ?C)  ?SpO2: 98%  ? ? ? ?General: Alert and in no acute distress.  ?Cardiovascular:  Good peripheral perfusion ?Respiratory: Normal respiratory effort without tachypnea or retractions. Lungs CTAB.  ?Gastrointestinal: Bowel sounds ?4 quadrants. Soft and nontender to palpation. No guarding or rigidity. No palpable masses. No distention. No CVA tenderness. ?Musculoskeletal: Full range of motion to all extremities.  ?Neurologic:  No gross focal neurologic deficits are appreciated.  ?Skin:   No rash noted ?Other: ? ? ?ED Results / Procedures / Treatments  ? ?Labs ?(all labs ordered are listed, but only abnormal results  are displayed) ?Labs Reviewed  ?URINALYSIS, ROUTINE W REFLEX MICROSCOPIC - Abnormal; Notable for the following components:  ?    Result Value  ? APPearance HAZY (*)   ? Hgb urine dipstick MODERATE (*)   ? Protein, ur 100 (*)   ? Nitrite POSITIVE (*)   ? Leukocytes,Ua LARGE (*)   ? All other components within normal limits  ?URINALYSIS, MICROSCOPIC (REFLEX) - Abnormal; Notable for the following components:  ? Bacteria, UA MANY (*)   ? All other components within normal limits  ?URINE CULTURE  ? ? ? ?EKG ? ? ? ? ?RADIOLOGY ? ? ? ?No results found. ? ?PROCEDURES: ? ?Critical Care performed: No ? ?Procedures ? ? ?MEDICATIONS ORDERED IN ED: ?Medications  ?cefTRIAXone (ROCEPHIN) injection 1 g (has no administration in time range)  ? ? ? ?IMPRESSION / MDM / ASSESSMENT AND PLAN / ED COURSE  ?I reviewed the triage vital signs and the nursing notes. ?             ?               ? ?Differential diagnosis includes, but is not limited to, UTI, cystitis,  functional diarrhea, infectious diarrhea ? ? ?Patient's diagnosis is consistent with UTI, side effect of antibiotic.  Patient presented to the urgent care with ongoing dysuria, polyuria, urgency as well as new onset of diarrhea.  Patient was diagnosed with a UTI 5 days ago and has been taking Monuril every other day.  Patient had a urine culture from her urologist which showed greater than 100,000 colonies of Citrobacter freundii.  Susceptibilities profile revealed susceptibility to Cipro though patient was on Monuril, resistance to cefuroxime, Bactrim.  It is susceptible to Rocephin.  As such I will give the patient a gram of Rocephin here, place the patient on cefpodoxime.  I recommended following up with her urologist regarding today's urinalysis and modify treatment plan.  Concerning signs and symptoms such as fever, inability to keep fluids down, worsening symptoms should precipitate a visit to the emergency department.  We have sent a culture today to confirm results..    Patient is given ED precautions to return to the ED for any worsening or new symptoms. ? ? ? ?  ? ? ?FINAL CLINICAL IMPRESSION(S) / ED DIAGNOSES  ? ?Final diagnoses:  ?Acute cystitis without hematuria  ? ? ? ?Rx / DC Orders  ? ?ED Discharge Orders   ? ?      Ordered  ?  cefpodoxime (VANTIN) 100 MG tablet  2 times daily       ? 04/18/22 0910  ? ?  ?  ? ?  ? ? ? ?Note:  This document was prepared using Dragon voice recognition software and may include unintentional dictation errors. ?  ?Racheal Patches, PA-C ?04/18/22 0915 ? ?

## 2022-04-21 ENCOUNTER — Telehealth: Payer: Self-pay | Admitting: Physician Assistant

## 2022-04-21 LAB — URINE CULTURE: Culture: 60000 — AB

## 2022-04-21 MED ORDER — CIPROFLOXACIN HCL 500 MG PO TABS
500.0000 mg | ORAL_TABLET | Freq: Two times a day (BID) | ORAL | 0 refills | Status: AC
Start: 2022-04-21 — End: 2022-04-28

## 2022-04-21 NOTE — Telephone Encounter (Signed)
Patient states the vantin antibiotic medication that she was given for UTI has not helped her symptoms in the past 3 days.  Would like a change in therapy.  Reviewed patient's culture result.  Positive Pseudomonas.  Switching therapy to Cipro.  Patient has taken this in the past despite other sensitivities to fluoroquinolones.  States she cannot take Cipro.  Sent to pharmacy. ?

## 2024-10-26 ENCOUNTER — Ambulatory Visit

## 2024-10-26 DIAGNOSIS — D1801 Hemangioma of skin and subcutaneous tissue: Secondary | ICD-10-CM

## 2024-10-26 DIAGNOSIS — L570 Actinic keratosis: Secondary | ICD-10-CM

## 2024-10-26 DIAGNOSIS — L821 Other seborrheic keratosis: Secondary | ICD-10-CM

## 2024-10-26 DIAGNOSIS — N904 Leukoplakia of vulva: Secondary | ICD-10-CM

## 2024-10-26 DIAGNOSIS — L578 Other skin changes due to chronic exposure to nonionizing radiation: Secondary | ICD-10-CM

## 2024-10-26 DIAGNOSIS — Z1283 Encounter for screening for malignant neoplasm of skin: Secondary | ICD-10-CM | POA: Diagnosis not present

## 2024-10-26 DIAGNOSIS — D229 Melanocytic nevi, unspecified: Secondary | ICD-10-CM | POA: Diagnosis not present

## 2024-10-26 DIAGNOSIS — L853 Xerosis cutis: Secondary | ICD-10-CM

## 2024-10-26 DIAGNOSIS — L814 Other melanin hyperpigmentation: Secondary | ICD-10-CM | POA: Diagnosis not present

## 2024-10-26 MED ORDER — CLOBETASOL PROPIONATE 0.05 % EX SOLN: CUTANEOUS | 5 refills | Status: AC

## 2024-10-26 MED ORDER — CLOBETASOL PROPIONATE 0.05 % EX OINT: TOPICAL_OINTMENT | CUTANEOUS | 5 refills | Status: AC

## 2024-10-26 NOTE — Progress Notes (Signed)
 Subjective   Desiree Keith is a 89 y.o. female who presents for the following: Total body skin exam for skin cancer screening and mole check. The patient has spots, moles and lesions to be evaluated, some may be new or changing and the patient may have concern these could be cancer.. Patient is new patient  Today patient reports: Itching on her back; has been treated for folliculitis with antibiotics that she completed about one month ago. She states that the itching improved but never resolved.   Review of Systems:    No other skin or systemic complaints except as noted in HPI or Assessment and Plan.  The following portions of the chart were reviewed this encounter and updated as appropriate: medications, allergies, medical history  Relevant Medical History:  n/a   Objective  Well appearing patient in no apparent distress; mood and affect are within normal limits. Examination was performed of the: Full Skin Examination: scalp, head, eyes, ears, nose, lips, neck, chest, axillae, abdomen, back, buttocks, bilateral upper extremities, bilateral lower extremities, hands, feet, fingers, toes, fingernails, and toenails.   Examination notable for: SKIN EXAM, Angioma(s): Scattered red vascular papule(s)  , Lentigo/lentigines: Scattered pigmented macules that are tan to brown in color and are somewhat non-uniform in shape and concentrated in the sun-exposed areas, Nevus/nevi: Scattered well-demarcated, regular, pigmented macule(s) and/or papule(s)  , Seborrheic Keratosis(es): Stuck-on appearing keratotic papule(s) on the trunk, none  irritated with redness, crusting, edema, and/or partial avulsion, Actinic Damage/Elastosis: chronic sun damage: dyspigmentation, telangiectasia, and wrinkling, Actinic keratosis: Scaly erythematous macule(s) concentrated on sun exposed areas  - Diffuse xerosis and scale accentuated on the back   Genital exam, chaperone present  - Hypopigmented plaques with red border  with an atrophic appearance on the vulvar area. - scarring/loss of R labia minora  - erythema and erosions of vulva   Examination limited by: Undergarments and Patient deferred removal     Face (3) Pink scaly macules  Assessment & Plan   SKIN CANCER SCREENING PERFORMED TODAY.  BENIGN SKIN FINDINGS  - Lentigines  - Seborrheic keratoses  - Hemangiomas   - Nevus/Multiple Benign Nevi  - Reassurance provided regarding the benign appearance of lesions noted on exam today; no treatment is indicated in the absence of symptoms/changes. - Reinforced importance of photoprotective strategies including liberal and frequent sunscreen use of a broad-spectrum SPF 30 or greater, use of protective clothing, and sun avoidance for prevention of cutaneous malignancy and photoaging.  Counseled patient on the importance of regular self-skin monitoring as well as routine clinical skin examinations as scheduled.   ACTINIC DAMAGE - Chronic condition, secondary to cumulative UV/sun exposure - Recommend daily broad spectrum sunscreen SPF 30+ to sun-exposed areas, reapply every 2 hours as needed.  - Staying in the shade or wearing long sleeves, sun glasses (UVA+UVB protection) and wide brim hats (4-inch brim around the entire circumference of the hat) are also recommended for sun protection.  - Call for new or changing lesions.  Xerosis cutis at the back - Discussed diagnosis, typical course, and treatment options for this condition - The patient was advised to use a gentle cleanser such as Dove or Cetaphil and to avoid anti-bacterial soaps which may be too irritating. We recommended the frequent use of a greasy emollient such as Cetaphil, Cerave or Vaseline to moisturize the skin once to twice daily. We also recommended the avoidance of scratching and irritants to the skin.  Scalp itch Chronic and persistent condition with duration  or expected duration over one year. Condition is symptomatic and bothersome to  patient. Patient is flaring and not currently at treatment goal.  - Discussed diagnosis, typical course, and treatment options for this condition - Explained to the patient the chronic nature of this diagnosis - Consider alternating with use of Head and Shoulders or Selsun Blue anti-dandruff shampoo which contains zinc pyrithione 1% - Start clobetasol solution 0.05% twice daily to affected skin Discussed side effect of super potent topical steroids including atrophy, dyspigmentation, striae, telangectasia, folliculitis, loss of skin pigment, hair growth, tachyphylaxis, risk of systemic absorption with missuse.  Vulvar lichen sclerosus, moderate - severe with scarring, flaring  Chronic and persistent condition with duration or expected duration over one year. Condition is symptomatic/ bothersome to patient. Not currently at goal. - Discussed the nature of the condition and treatment options with the patient.   - Discussed that she does have significant scarring on exam with alteration of the vulvar architecture due to her disease.  We discussed that the use of high-potency topical corticosteroid is important for preventing any further scarring, however it's use will not reverse the scarring present on exam today. - Advised the patient of an increased risk of squamous cell carcinoma of the genital area (~5%) in patients with lichen sclerosus.  She was advised to periodically palpate the labia majora for any new bumps.  We discussed the important of using the topical corticosteroids to minimize this risk. - Will initiate clobetasol 0.05% ointment daily until return visit   - Can transition to maintenance once stabalized   Level of service outlined above   Procedures, orders, diagnosis for this visit:  ACTINIC KERATOSIS (3) Face (3) Actinic keratoses are precancerous spots that appear secondary to cumulative UV radiation exposure/sun exposure over time. They are chronic with expected duration over 1  year. A portion of actinic keratoses will progress to squamous cell carcinoma of the skin. It is not possible to reliably predict which spots will progress to skin cancer and so treatment is recommended to prevent development of skin cancer.  Recommend daily broad spectrum sunscreen SPF 30+ to sun-exposed areas, reapply every 2 hours as needed.  Recommend staying in the shade or wearing long sleeves, sun glasses (UVA+UVB protection) and wide brim hats (4-inch brim around the entire circumference of the hat). Call for new or changing lesions. Destruction of lesion - Face (3) Complexity: simple   Destruction method: cryotherapy   Informed consent: discussed and consent obtained   Timeout:  patient name, date of birth, surgical site, and procedure verified Lesion destroyed using liquid nitrogen: Yes   Region frozen until ice ball extended beyond lesion: Yes   Cryo cycles: 1 or 2. Outcome: patient tolerated procedure well with no complications   Post-procedure details: wound care instructions given     Actinic keratosis -     Destruction of lesion  Other orders -     Clobetasol Propionate; Apply 1 mL to affected areas of skin twice daily  Dispense: 50 mL; Refill: 5 -     Clobetasol Propionate; Apply 1 gram topically to affected area of skin daily. Stop once resolved and restart as needed for flares. Avoid use on face, armpits, groin unless otherwise indicated.  Dispense: 60 g; Refill: 5    Return to clinic: Return in about 8 weeks (around 12/21/2024) for Lichen sclerosis .  I, Emerick Ege, CMA am acting as scribe for Lauraine JAYSON Kanaris, MD.   Documentation: I have reviewed the above documentation  for accuracy and completeness, and I agree with the above.  Lauraine JAYSON Kanaris, MD

## 2024-10-26 NOTE — Patient Instructions (Signed)

## 2024-12-25 ENCOUNTER — Ambulatory Visit

## 2024-12-25 DIAGNOSIS — N904 Leukoplakia of vulva: Secondary | ICD-10-CM | POA: Diagnosis not present

## 2024-12-25 DIAGNOSIS — I872 Venous insufficiency (chronic) (peripheral): Secondary | ICD-10-CM | POA: Diagnosis not present

## 2024-12-25 DIAGNOSIS — L853 Xerosis cutis: Secondary | ICD-10-CM | POA: Diagnosis not present

## 2024-12-25 DIAGNOSIS — L2084 Intrinsic (allergic) eczema: Secondary | ICD-10-CM | POA: Diagnosis not present

## 2024-12-25 MED ORDER — TRIAMCINOLONE ACETONIDE 0.1 % EX OINT: TOPICAL_OINTMENT | CUTANEOUS | 2 refills | Status: AC

## 2024-12-25 NOTE — Progress Notes (Signed)
 "   Subjective   Desiree Keith is a 89 y.o. female who presents for the following: Follow up of lichen sclerosis. Patient is established patient   Today patient reports: Improvment of Lichen Sclerosis with Clobetasol  ointments. Itchy lesion on her back x6 months.   Review of Systems:    No other skin or systemic complaints except as noted in HPI or Assessment and Plan.  The following portions of the chart were reviewed this encounter and updated as appropriate: medications, allergies, medical history  Relevant Medical History:  n/a   Objective  (SKPE) Well appearing patient in no apparent distress; mood and affect are within normal limits. Examination was performed of the: Focused Exam of: lower extremities back and chest  patient deferred exam of vulvar area  Examination notable for: Xerosis: Diffuse xerosis and scale accentuated on the extremities , red scaly excoriated plaques of back and chest  - Woody induration of lower legs with marked hyperpigmentation and orange discoloration. No ulcerations  Deferred genital exam today  Examination limited by: Undergarments and Patient deferred removal       Assessment & Plan  (SKAP)   Xerosis cutis w/component of mild eczema of the chest and back Chronic and persistent condition with duration or expected duration over one year. Condition is symptomatic/ bothersome to patient. Not currently at goal. - Discussed diagnosis, typical course, and treatment options for this condition - The patient was advised to use a gentle cleanser such as Dove or Cetaphil and to avoid anti-bacterial soaps which may be too irritating. We recommended the frequent use of a greasy emollient such as Cetaphil, Cerave or Vaseline to moisturize the skin once to twice daily. We also recommended the avoidance of scratching and irritants to the skin. - Start triamcinolone ointment 0.1% twice daily to affected areas of skin Discussed side effect of potent topical  steroids including atrophy, dyspigmentation, striae, telangectasia, folliculitis, loss of skin pigment, hair growth, tachyphylaxis, risk of systemic absorption with missuse.  Stasis dermatitis of bilateral lower extremities  Chronic and persistent condition with duration or expected duration over one year. Condition is symptomatic/ bothersome to patient. Not currently at goal. - Informed the patient that this condition is caused by swelling in his legs and reducing this swelling is the ultimate treatment - Encouraged to elevate their legs for 1 hour three times a day - Recommend compression stockings daily - Start TAC 0.1% ointment to legs BID for erythema, pruritis, and scaling  Vulvar lichen sclerosus, moderate - severe  Chronic condition with duration or expected duration over one year. Patient deferred exam today but reports currently well-controlled. - Discussed the nature of the condition and treatment options with the patient.   - Discussed that she does have significant scarring on exam with alteration of the vulvar architecture due to her disease.  We discussed that the use of high-potency topical corticosteroid is important for preventing any further scarring, however it's use will not reverse the scarring present on exam today. - Advised the patient of an increased risk of squamous cell carcinoma of the genital area (~5%) in patients with lichen sclerosus.  She was advised to periodically palpate the labia majora for any new bumps.  We discussed the important of using the topical corticosteroids to minimize this risk. - continue clobetasol  0.05% ointment 3x weekly for maintenance, increase for flares - stressed importance of exams at least q32mo   Was sun protection counseling provided?: No   Level of service outlined above  Patient instructions (SKPI)   Procedures, orders, diagnosis for this visit:  INTRINSIC ECZEMA   LICHEN SCLEROSUS OF VULVA   STASIS  DERMATITIS    Intrinsic eczema  Lichen sclerosus of vulva  Stasis dermatitis  Other orders -     Triamcinolone Acetonide; Apply topically twice daily to affected areas of skin. Stop once resolved and restart as needed for flares. Avoid use on face, armpits, groin unless otherwise indicated.  Dispense: 225 each; Refill: 2    Return to clinic: Return if symptoms worsen or fail to improve.  I, Emerick Ege, CMA am acting as scribe for Lauraine JAYSON Kanaris, MD.   Documentation: I have reviewed the above documentation for accuracy and completeness, and I agree with the above.  Lauraine JAYSON Kanaris, MD  "

## 2024-12-25 NOTE — Patient Instructions (Addendum)
 Moisturizer: Apply a moisturizer throughout the day and after bathing.  When you moisturize after bathing, this locks in the moisture.  This can lead to softer and smoother skin.  Body moisturizers come in ointments, creams, and lotions.  If you have dry skin, we recommend the use of ointments or creams rather than lotions.  In other words, something you scoop out of a jar rather than squirted out.  Ointments and creams are thicker and thus provide better moisturization.      Moisturizers Apply a moisturizer to your skin at least once a day (even if you do not bathe).   Cool moisturizers on your skin help with itching (to accomplish this, place your moisturizers and medicated creams in the refrigerator). In general, people with dry skin need a moisturizer that is scooped and not squirted.  - Ointments (petrolatum ointment): greasy, but are the best moisturizers Vaseline, Aquaphor  - Creams (thick, white cream that comes in a jar and is scooped with your hand) Cerave, Cetaphil, Eucerin, Vanicream  - Lotions (comes in a pump) is the weakest moisturizer but is an acceptable choice for the face if you have an oily face Cetaphil, Cerave, Curel, Neutrogena, Lubriderm, Aveeno     Due to recent changes in healthcare laws, you may see results of your pathology and/or laboratory studies on MyChart before the doctors have had a chance to review them. We understand that in some cases there may be results that are confusing or concerning to you. Please understand that not all results are received at the same time and often the doctors may need to interpret multiple results in order to provide you with the best plan of care or course of treatment. Therefore, we ask that you please give us  2 business days to thoroughly review all your results before contacting the office for clarification. Should we see a critical lab result, you will be contacted sooner.   If You Need Anything After Your Visit  If  you have any questions or concerns for your doctor, please call our main line at [phone] and press option 4 to reach your doctor's medical assistant. If no one answers, please leave a voicemail as directed and we will return your call as soon as possible. Messages left after 4 pm will be answered the following business day.   You may also send us  a message via MyChart. We typically respond to MyChart messages within 1-2 business days.  For prescription refills, please ask your pharmacy to contact our office. Our fax number is [phone].  If you have an urgent issue when the clinic is closed that cannot wait until the next business day, you can page your doctor at the number below.    Please note that while we do our best to be available for urgent issues outside of office hours, we are not available 24/7.   If you have an urgent issue and are unable to reach us , you may choose to seek medical care at your doctor's office, retail clinic, urgent care center, or emergency room.  If you have a medical emergency, please immediately call 911 or go to the emergency department.  Pager Numbers  - Dr. Hester: [phone]  - Dr. Jackquline: [phone]  - Dr. Claudene: [phone]   - Dr. Raymund: [phone]  In the event of inclement weather, please call our main line at [phone] for an update on the status of any delays or closures.  Dermatology Medication Tips: Please keep the boxes  that topical medications come in in order to help keep track of the instructions about where and how to use these. Pharmacies typically print the medication instructions only on the boxes and not directly on the medication tubes.   If your medication is too expensive, please contact our office at [phone] option 4 or send us  a message through MyChart.   We are unable to tell what your co-pay for medications will be in advance as this is different depending on your insurance coverage. However, we may  be able to find a substitute medication at lower cost or fill out paperwork to get insurance to cover a needed medication.   If a prior authorization is required to get your medication covered by your insurance company, please allow us  1-2 business days to complete this process.  Drug prices often vary depending on where the prescription is filled and some pharmacies may offer cheaper prices.  The website www.goodrx.com contains coupons for medications through different pharmacies. The prices here do not account for what the cost may be with help from insurance (it may be cheaper with your insurance), but the website can give you the price if you did not use any insurance.  - You can print the associated coupon and take it with your prescription to the pharmacy.  - You may also stop by our office during regular business hours and pick up a GoodRx coupon card.  - If you need your prescription sent electronically to a different pharmacy, notify our office through [facility] or by phone at [phone] option 4.     Si Usted Necesita Algo Despus de Su Visita  Tambin puede enviarnos un mensaje a travs de Clinical cytogeneticist. Por lo general respondemos a los mensajes de MyChart en el transcurso de 1 a 2 das hbiles.  Para renovar recetas, por favor pida a su farmacia que se ponga en contacto con nuestra oficina. Randi lakes de fax es [place] [phone].  Si tiene un asunto urgente cuando la clnica est cerrada y que no puede esperar hasta el siguiente da hbil, puede llamar/localizar a su doctor(a) al nmero que aparece a continuacin.   Por favor, tenga en cuenta que aunque hacemos todo lo posible para estar disponibles para asuntos urgentes fuera del horario de [place], no estamos disponibles las 24 horas del da, los 7 [address]  [address] de la [place].   Si tiene un problema urgente y no puede comunicarse con nosotros, puede optar por buscar atencin mdica  en el consultorio de su doctor(a), en una  clnica privada, en un centro de atencin urgente o en una sala de emergencias.  Si tiene Engineer, drilling, por favor llame inmediatamente al 911 o vaya a la sala de emergencias.  Nmeros de bper  - Dr. Hester: [phone]  - Dra. Jackquline: [phone]  - Dr. Claudene: [phone]  - Dra. Kitts: [phone]  En caso de inclemencias del [place], por favor llame a nuestra lnea principal al [phone] para una actualizacin sobre el estado de cualquier retraso o cierre.  Consejos para la medicacin en dermatologa: Por favor, guarde las cajas en las que vienen los medicamentos de uso tpico para ayudarle a seguir las instrucciones sobre dnde y cmo usarlos. Las farmacias generalmente imprimen las instrucciones del medicamento slo en las cajas y no directamente en los tubos del [place].   Si su medicamento es muy caro, por favor, pngase en contacto con landry rieger llamando al [phone] y presione la opcin 4 o envenos un mensaje a  travs de MyChart.   No podemos decirle cul ser su copago por los medicamentos por adelantado ya que esto es diferente dependiendo de la cobertura de su seguro. Sin embargo, es posible que podamos encontrar un medicamento sustituto a Audiological scientist un formulario para que el seguro cubra el medicamento que se considera necesario.   Si se requiere una autorizacin previa para que su compaa de seguros malta su medicamento, por favor permtanos de 1 a 2 das hbiles para completar este proceso.  Los precios de los medicamentos varan con frecuencia dependiendo del Environmental consultant de dnde se surte la receta y alguna farmacias pueden ofrecer precios ms baratos.  El sitio web www.goodrx.com tiene cupones para medicamentos de Health and safety inspector. Los precios aqu no tienen en cuenta lo que podra costar con la ayuda del seguro (puede ser ms barato con su seguro), pero el sitio web puede darle el precio si no utiliz Tourist information centre manager.  - Puede  imprimir el cupn correspondiente y llevarlo con su receta a la farmacia.  - Tambin puede pasar por nuestra oficina durante el horario de atencin regular y Education officer, museum una tarjeta de cupones de GoodRx.  - Si necesita que su receta se enve electrnicamente a una farmacia diferente, informe a nuestra oficina a travs de MyChart de [place] o por telfono llamando al [phone] y presione la opcin 4.
